# Patient Record
Sex: Female | Born: 1950 | Race: Black or African American | Hispanic: No | Marital: Single | Smoking: Current every day smoker
Health system: Southern US, Community
[De-identification: ages and names within clinical notes are randomized; demographics above are authoritative.]

## PROBLEM LIST (undated history)

## (undated) DIAGNOSIS — I10 Essential (primary) hypertension: Secondary | ICD-10-CM

## (undated) DIAGNOSIS — G459 Transient cerebral ischemic attack, unspecified: Secondary | ICD-10-CM

## (undated) DIAGNOSIS — E7800 Pure hypercholesterolemia, unspecified: Secondary | ICD-10-CM

## (undated) DIAGNOSIS — G43909 Migraine, unspecified, not intractable, without status migrainosus: Secondary | ICD-10-CM

## (undated) DIAGNOSIS — E039 Hypothyroidism, unspecified: Secondary | ICD-10-CM

## (undated) DIAGNOSIS — T8859XA Other complications of anesthesia, initial encounter: Secondary | ICD-10-CM

## (undated) DIAGNOSIS — E079 Disorder of thyroid, unspecified: Secondary | ICD-10-CM

## (undated) DIAGNOSIS — T4145XA Adverse effect of unspecified anesthetic, initial encounter: Secondary | ICD-10-CM

## (undated) DIAGNOSIS — M1990 Unspecified osteoarthritis, unspecified site: Secondary | ICD-10-CM

## (undated) HISTORY — PX: VAGINAL HYSTERECTOMY: SUR661

## (undated) HISTORY — PX: APPENDECTOMY: SHX54

## (undated) HISTORY — PX: PLANTAR FASCIA SURGERY: SHX746

---

## 2005-01-14 ENCOUNTER — Ambulatory Visit: Payer: Self-pay | Admitting: Cardiology

## 2006-12-21 ENCOUNTER — Encounter: Admission: RE | Admit: 2006-12-21 | Discharge: 2006-12-21 | Payer: Self-pay | Admitting: Orthopedic Surgery

## 2013-03-19 ENCOUNTER — Ambulatory Visit: Payer: BC Managed Care – PPO | Attending: *Deleted | Admitting: Physical Therapy

## 2013-03-19 DIAGNOSIS — R5381 Other malaise: Secondary | ICD-10-CM | POA: Insufficient documentation

## 2013-03-19 DIAGNOSIS — M25559 Pain in unspecified hip: Secondary | ICD-10-CM | POA: Insufficient documentation

## 2013-03-19 DIAGNOSIS — IMO0001 Reserved for inherently not codable concepts without codable children: Secondary | ICD-10-CM | POA: Insufficient documentation

## 2013-03-20 ENCOUNTER — Ambulatory Visit: Payer: BC Managed Care – PPO | Admitting: Physical Therapy

## 2013-03-25 ENCOUNTER — Ambulatory Visit: Payer: BC Managed Care – PPO | Admitting: Physical Therapy

## 2013-03-27 ENCOUNTER — Ambulatory Visit: Payer: BC Managed Care – PPO | Admitting: Physical Therapy

## 2013-04-01 ENCOUNTER — Encounter: Payer: BC Managed Care – PPO | Admitting: Physical Therapy

## 2013-04-08 ENCOUNTER — Encounter: Payer: BC Managed Care – PPO | Admitting: Physical Therapy

## 2017-05-01 ENCOUNTER — Observation Stay (HOSPITAL_COMMUNITY): Payer: Medicare Other

## 2017-05-01 ENCOUNTER — Other Ambulatory Visit: Payer: Self-pay

## 2017-05-01 ENCOUNTER — Encounter (HOSPITAL_BASED_OUTPATIENT_CLINIC_OR_DEPARTMENT_OTHER): Payer: Self-pay

## 2017-05-01 ENCOUNTER — Observation Stay (HOSPITAL_BASED_OUTPATIENT_CLINIC_OR_DEPARTMENT_OTHER)
Admission: EM | Admit: 2017-05-01 | Discharge: 2017-05-03 | Disposition: A | Discharge status: Home / Self Care | Payer: Medicare Other | Attending: Family Medicine | Admitting: Family Medicine

## 2017-05-01 ENCOUNTER — Emergency Department (HOSPITAL_BASED_OUTPATIENT_CLINIC_OR_DEPARTMENT_OTHER): Payer: Medicare Other

## 2017-05-01 DIAGNOSIS — I10 Essential (primary) hypertension: Secondary | ICD-10-CM | POA: Diagnosis present

## 2017-05-01 DIAGNOSIS — I639 Cerebral infarction, unspecified: Secondary | ICD-10-CM | POA: Diagnosis present

## 2017-05-01 DIAGNOSIS — Z23 Encounter for immunization: Secondary | ICD-10-CM | POA: Diagnosis not present

## 2017-05-01 DIAGNOSIS — K118 Other diseases of salivary glands: Secondary | ICD-10-CM | POA: Insufficient documentation

## 2017-05-01 DIAGNOSIS — Z79899 Other long term (current) drug therapy: Secondary | ICD-10-CM | POA: Insufficient documentation

## 2017-05-01 DIAGNOSIS — F17200 Nicotine dependence, unspecified, uncomplicated: Secondary | ICD-10-CM | POA: Diagnosis not present

## 2017-05-01 DIAGNOSIS — E785 Hyperlipidemia, unspecified: Secondary | ICD-10-CM | POA: Diagnosis not present

## 2017-05-01 DIAGNOSIS — G459 Transient cerebral ischemic attack, unspecified: Secondary | ICD-10-CM | POA: Diagnosis not present

## 2017-05-01 DIAGNOSIS — R531 Weakness: Secondary | ICD-10-CM | POA: Diagnosis present

## 2017-05-01 HISTORY — DX: Other complications of anesthesia, initial encounter: T88.59XA

## 2017-05-01 HISTORY — DX: Unspecified osteoarthritis, unspecified site: M19.90

## 2017-05-01 HISTORY — DX: Pure hypercholesterolemia, unspecified: E78.00

## 2017-05-01 HISTORY — DX: Disorder of thyroid, unspecified: E07.9

## 2017-05-01 HISTORY — DX: Transient cerebral ischemic attack, unspecified: G45.9

## 2017-05-01 HISTORY — DX: Adverse effect of unspecified anesthetic, initial encounter: T41.45XA

## 2017-05-01 HISTORY — DX: Migraine, unspecified, not intractable, without status migrainosus: G43.909

## 2017-05-01 HISTORY — DX: Essential (primary) hypertension: I10

## 2017-05-01 HISTORY — DX: Hypothyroidism, unspecified: E03.9

## 2017-05-01 LAB — ETHANOL: Alcohol, Ethyl (B): <10 mg/dL (ref <10)

## 2017-05-01 LAB — COMPREHENSIVE METABOLIC PANEL
ALT: 31 U/L (ref 14–54)
ANION GAP: 6 (ref 5–15)
AST: 28 U/L (ref 15–41)
Albumin: 3.6 g/dL (ref 3.5–5.0)
Alkaline Phosphatase: 113 U/L (ref 38–126)
BUN: 16 mg/dL (ref 6–20)
CHLORIDE: 106 mmol/L (ref 101–111)
CO2: 27 mmol/L (ref 22–32)
CREATININE: 0.8 mg/dL (ref 0.44–1.00)
Calcium: 9.3 mg/dL (ref 8.9–10.3)
Glucose, Bld: 109 mg/dL — ABNORMAL HIGH (ref 65–99)
POTASSIUM: 3.4 mmol/L — AB (ref 3.5–5.1)
SODIUM: 139 mmol/L (ref 135–145)
Total Bilirubin: 0.5 mg/dL (ref 0.3–1.2)
Total Protein: 7.1 g/dL (ref 6.5–8.1)

## 2017-05-01 LAB — URINALYSIS, ROUTINE W REFLEX MICROSCOPIC
Bilirubin Urine: NEGATIVE
Glucose, UA: NEGATIVE mg/dL
HGB URINE DIPSTICK: NEGATIVE
Ketones, ur: NEGATIVE mg/dL
LEUKOCYTES UA: NEGATIVE
Nitrite: NEGATIVE
PROTEIN: NEGATIVE mg/dL
Specific Gravity, Urine: 1.015 (ref 1.005–1.030)
pH: 7.5 (ref 5.0–8.0)

## 2017-05-01 LAB — RAPID URINE DRUG SCREEN, HOSP PERFORMED
Amphetamines: NOT DETECTED
BARBITURATES: NOT DETECTED
Benzodiazepines: NOT DETECTED
Cocaine: NOT DETECTED
Opiates: NOT DETECTED
Tetrahydrocannabinol: NOT DETECTED

## 2017-05-01 LAB — CBC
HEMATOCRIT: 39.1 % (ref 36.0–46.0)
Hemoglobin: 12.9 g/dL (ref 12.0–15.0)
MCH: 26.9 pg (ref 26.0–34.0)
MCHC: 33 g/dL (ref 30.0–36.0)
MCV: 81.6 fL (ref 78.0–100.0)
PLATELETS: 199 10*3/uL (ref 150–400)
RBC: 4.79 MIL/uL (ref 3.87–5.11)
RDW: 17.6 % — ABNORMAL HIGH (ref 11.5–15.5)
WBC: 7.2 10*3/uL (ref 4.0–10.5)

## 2017-05-01 LAB — PROTIME-INR
INR: 0.95
PROTHROMBIN TIME: 12.6 s (ref 11.4–15.2)

## 2017-05-01 LAB — DIFFERENTIAL
BASOS PCT: 0 %
Basophils Absolute: 0 10*3/uL (ref 0.0–0.1)
EOS PCT: 2 %
Eosinophils Absolute: 0.1 10*3/uL (ref 0.0–0.7)
Lymphocytes Relative: 21 %
Lymphs Abs: 1.5 10*3/uL (ref 0.7–4.0)
MONO ABS: 0.4 10*3/uL (ref 0.1–1.0)
MONOS PCT: 6 %
NEUTROS ABS: 5.1 10*3/uL (ref 1.7–7.7)
Neutrophils Relative %: 71 %

## 2017-05-01 LAB — CBG MONITORING, ED: GLUCOSE-CAPILLARY: 96 mg/dL (ref 65–99)

## 2017-05-01 LAB — APTT: aPTT: 33 seconds (ref 24–36)

## 2017-05-01 LAB — TROPONIN I

## 2017-05-01 MED ORDER — BENAZEPRIL HCL 20 MG PO TABS
20.0000 mg | ORAL_TABLET | Freq: Every day | ORAL | Status: DC
  Administered 2017-05-02 – 2017-05-03 (×2): 20 mg via ORAL
  Filled 2017-05-01 (×2): qty 1

## 2017-05-01 MED ORDER — STROKE: EARLY STAGES OF RECOVERY BOOK
Freq: Once | Status: AC
  Administered 2017-05-02
  Filled 2017-05-01: qty 1

## 2017-05-01 MED ORDER — ATORVASTATIN CALCIUM 40 MG PO TABS
40.0000 mg | ORAL_TABLET | Freq: Every day | ORAL | Status: DC
  Administered 2017-05-02: 40 mg via ORAL
  Filled 2017-05-01: qty 1

## 2017-05-01 MED ORDER — ASPIRIN 300 MG RE SUPP: 300.0000 mg | Freq: Every day | RECTAL | Status: DC

## 2017-05-01 MED ORDER — ACETAMINOPHEN 160 MG/5ML PO SOLN: 650.0000 mg | ORAL | Status: DC | PRN

## 2017-05-01 MED ORDER — LEVOTHYROXINE SODIUM 25 MCG PO TABS
125.0000 ug | ORAL_TABLET | Freq: Every day | ORAL | Status: DC
  Administered 2017-05-02 – 2017-05-03 (×2): 125 ug via ORAL
  Filled 2017-05-01 (×3): qty 1

## 2017-05-01 MED ORDER — ACETAMINOPHEN 325 MG PO TABS: 650.0000 mg | ORAL_TABLET | ORAL | Status: DC | PRN

## 2017-05-01 MED ORDER — AMLODIPINE BESY-BENAZEPRIL HCL 5-20 MG PO CAPS: 1.0000 | ORAL_CAPSULE | Freq: Every day | ORAL | Status: DC

## 2017-05-01 MED ORDER — ACETAMINOPHEN 650 MG RE SUPP: 650.0000 mg | RECTAL | Status: DC | PRN

## 2017-05-01 MED ORDER — ENOXAPARIN SODIUM 40 MG/0.4ML ~~LOC~~ SOLN
40.0000 mg | SUBCUTANEOUS | Status: DC
  Administered 2017-05-02: 40 mg via SUBCUTANEOUS
  Filled 2017-05-01: qty 0.4

## 2017-05-01 MED ORDER — ASPIRIN 325 MG PO TABS
325.0000 mg | ORAL_TABLET | Freq: Every day | ORAL | Status: DC
  Administered 2017-05-02 – 2017-05-03 (×3): 325 mg via ORAL
  Filled 2017-05-01 (×3): qty 1

## 2017-05-01 MED ORDER — IOPAMIDOL (ISOVUE-370) INJECTION 76%
INTRAVENOUS | Status: AC
  Administered 2017-05-01: 50 mL
  Filled 2017-05-01: qty 50

## 2017-05-01 MED ORDER — SENNOSIDES-DOCUSATE SODIUM 8.6-50 MG PO TABS: 1.0000 | ORAL_TABLET | Freq: Every evening | ORAL | Status: DC | PRN

## 2017-05-01 MED ORDER — LORAZEPAM 2 MG/ML IJ SOLN: 1.0000 mg | Freq: Once | INTRAMUSCULAR | Status: DC

## 2017-05-01 MED ORDER — LORAZEPAM 2 MG/ML IJ SOLN
2.0000 mg | Freq: Once | INTRAMUSCULAR | Status: AC
  Administered 2017-05-01: 2 mg via INTRAVENOUS
  Filled 2017-05-01: qty 1

## 2017-05-01 MED ORDER — HYDROCHLOROTHIAZIDE 25 MG PO TABS
25.0000 mg | ORAL_TABLET | Freq: Every day | ORAL | Status: DC
  Administered 2017-05-02 – 2017-05-03 (×2): 25 mg via ORAL
  Filled 2017-05-01 (×2): qty 1

## 2017-05-01 MED ORDER — AMLODIPINE BESYLATE 5 MG PO TABS
5.0000 mg | ORAL_TABLET | Freq: Every day | ORAL | Status: DC
  Administered 2017-05-02 – 2017-05-03 (×2): 5 mg via ORAL
  Filled 2017-05-01 (×2): qty 1

## 2017-05-01 NOTE — ED Notes (Signed)
Unsuccessful attempt at giving report to 3W.   

## 2017-05-01 NOTE — ED Notes (Signed)
Family at bedside. 

## 2017-05-01 NOTE — ED Notes (Signed)
Report to Renae FicklePaul, RN with [facility]

## 2017-05-01 NOTE — ED Notes (Signed)
EDP at the bedside and tele MD on Tele stroke cart

## 2017-05-01 NOTE — ED Notes (Signed)
On way to CT 

## 2017-05-01 NOTE — ED Notes (Signed)
Report given to Lamount Crankeruth Miller, RN Charge Nurse.

## 2017-05-01 NOTE — ED Notes (Signed)
ED Provider at bedside. 

## 2017-05-01 NOTE — ED Triage Notes (Addendum)
Pt states she was at home on the toilet approx 10am-walked in BR w/o difficuly but then was unable to bear weight on left leg-pt presents to triage in w/c-was able to stand, dragging left leg to stand on scale-pt denies pain to left LE

## 2017-05-01 NOTE — ED Notes (Signed)
[facility]

## 2017-05-01 NOTE — H&P (Signed)
History and Physical    Nancy Houston YQI:[id number] DOB: March 14, 1951 DOA: 05/01/2017  PCP: Domenic Schwab, FNP  Patient coming from: Home  I have personally briefly reviewed patient's old medical records in [facility]  Chief Complaint: L leg weakness  HPI: Nancy Houston is a 67 y.o. female with medical history significant of HTN, HLD, tobacco abuse.  Patient presents to the ED at [facility] with c/o transient left leg weakness.  Got up and walked to bathroom without difficulty.  Urinated.  Had diffuse weakness of left leg when she tried to stand up.  No numbness nor tingling, no numbness.  No pain.  Never had anything like this before.  Steady improvement over course of day now close to baseline.   ED Course: Transferred to [facility] ED, neurology eval.  MRI brain neg.  Getting admitted for TIA work up.   Review of Systems: As per HPI otherwise 10 point review of systems negative.   Past Medical History:  Diagnosis Date  . High cholesterol   . Hypertension   . Thyroid disease     Past Surgical History:  Procedure Laterality Date  . ABDOMINAL HYSTERECTOMY       reports that she has been smoking.  she has never used smokeless tobacco. She reports that she drinks alcohol. She reports that she does not use drugs.  Allergies  Allergen Reactions  . Penicillins     unknown    Family History  Problem Relation Age of Onset  . Transient ischemic attack Mother      Prior to Admission medications   Medication Sig Start Date End Date Taking? Authorizing Provider  amLODipine-benazepril (LOTREL) 5-20 MG capsule Take 1 capsule by mouth daily. 08/09/16  Yes [provider]  atorvastatin (LIPITOR) 40 MG tablet Take 40 mg by mouth daily.    Yes [provider]  hydrochlorothiazide (HYDRODIURIL) 25 MG tablet Take 25 mg by mouth daily. 08/09/16  Yes [provider]  levothyroxine (SYNTHROID) 125 MCG tablet Take 125 mcg by mouth daily.    Yes [provider]    Physical Exam: Vitals:   05/01/17 1830 05/01/17 2011 05/01/17 2014 05/01/17 2030  BP: (!) 110/93 (!) 141/91 (!) 141/91 (!) 146/96  Pulse: 76  94 93  Resp: 15  19 17   Temp:      TempSrc:      SpO2: 99%  97% 94%  Weight:      Height:        Constitutional: NAD, calm, comfortable Eyes: PERRL, lids and conjunctivae normal ENMT: Mucous membranes are moist. Posterior pharynx clear of any exudate or lesions.Normal dentition.  Neck: normal, supple, no masses, no thyromegaly Respiratory: clear to auscultation bilaterally, no wheezing, no crackles. Normal respiratory effort. No accessory muscle use.  Cardiovascular: Regular rate and rhythm, no murmurs / rubs / gallops. No extremity edema. 2+ pedal pulses. No carotid bruits.  Abdomen: no tenderness, no masses palpated. No hepatosplenomegaly. Bowel sounds positive.  Musculoskeletal: no clubbing / cyanosis. No joint deformity upper and lower extremities. Good ROM, no contractures. Normal muscle tone.  Skin: no rashes, lesions, ulcers. No induration Neurologic: CN 2-12 grossly intact. Sensation intact, DTR normal. Strength 5/5 in all 4.  Psychiatric: Normal judgment and insight. Alert and oriented x 3. Normal mood.    Labs on Admission: I have personally reviewed following labs and imaging studies  CBC: Recent Labs  Lab 05/01/17 1355  WBC 7.2  NEUTROABS 5.1  HGB 12.9  HCT  39.1  MCV 81.6  PLT 199   Basic Metabolic Panel: Recent Labs  Lab 05/01/17 1355  NA 139  K 3.4*  CL 106  CO2 27  GLUCOSE 109*  BUN 16  CREATININE 0.80  CALCIUM 9.3   GFR: Estimated Creatinine Clearance: 93.2 mL/min (by C-G formula based on SCr of 0.8 mg/dL). Liver Function Tests: Recent Labs  Lab 05/01/17 1355  AST 28  ALT 31  ALKPHOS 113  BILITOT 0.5  PROT 7.1  ALBUMIN 3.6   No results for input(s): LIPASE, AMYLASE in the last 168 hours. No results for input(s): AMMONIA in the last 168 hours. Coagulation Profile: Recent  Labs  Lab 05/01/17 1355  INR 0.95   Cardiac Enzymes: Recent Labs  Lab 05/01/17 1355  TROPONINI <0.03   BNP (last 3 results) No results for input(s): PROBNP in the last 8760 hours. HbA1C: No results for input(s): HGBA1C in the last 72 hours. CBG: Recent Labs  Lab 05/01/17 1358  GLUCAP 96   Lipid Profile: No results for input(s): CHOL, HDL, LDLCALC, TRIG, CHOLHDL, LDLDIRECT in the last 72 hours. Thyroid Function Tests: No results for input(s): TSH, T4TOTAL, FREET4, T3FREE, THYROIDAB in the last 72 hours. Anemia Panel: No results for input(s): VITAMINB12, FOLATE, FERRITIN, TIBC, IRON, RETICCTPCT in the last 72 hours. Urine analysis:    Component Value Date/Time   COLORURINE YELLOW 05/01/2017 1453   APPEARANCEUR CLEAR 05/01/2017 1453   LABSPEC 1.015 05/01/2017 1453   PHURINE 7.5 05/01/2017 1453   GLUCOSEU NEGATIVE 05/01/2017 1453   HGBUR NEGATIVE 05/01/2017 1453   BILIRUBINUR NEGATIVE 05/01/2017 1453   KETONESUR NEGATIVE 05/01/2017 1453   PROTEINUR NEGATIVE 05/01/2017 1453   NITRITE NEGATIVE 05/01/2017 1453   LEUKOCYTESUR NEGATIVE 05/01/2017 1453    Radiological Exams on Admission: Mr Brain Wo Contrast (neuro Protocol)  Result Date: 05/01/2017 CLINICAL DATA:  LEFT leg weakness after bowel movement. History of hypertension and hypercholesterolemia. EXAM: MRI HEAD WITHOUT CONTRAST TECHNIQUE: Multiplanar, multiecho pulse sequences of the brain and surrounding structures were obtained without intravenous contrast. COMPARISON:  CT HEAD May 01, 2017 at 1412 hours FINDINGS: Multiple sequences are moderately or severely motion degraded. BRAIN: No reduced diffusion to suggest acute ischemia. Mild-to-moderate global parenchymal brain volume loss. No hydrocephalus. Patchy to confluent supratentorial white matter FLAIR T2 hyperintensities. Prominent basal ganglia and thalami perivascular spaces associated with chronic small vessel ischemic disease. No midline shift, mass effect or  masses. No abnormal extra-axial fluid collections. VASCULAR: Normal major intracranial vascular flow voids present at skull base. SKULL AND UPPER CERVICAL SPINE: Partially empty sella. No suspicious calvarial bone marrow signal. Craniocervical junction maintained. SINUSES/ORBITS: Included paranasal sinuses are well aerated. Imaged mastoid air cells are well aerated. The included ocular globes and orbital contents are non-suspicious. Status post bilateral ocular lens implants. OTHER: None. IMPRESSION: 1. No acute intracranial process on this motion degraded examination. 2. Moderate to severe white matter changes seen with chronic small vessel ischemic disease. 3. Mild to moderate parenchymal brain volume loss, advanced for age. 4. Partially empty sella. Electronically Signed   By: Awilda Metroourtnay  Bloomer M.D.   On: 05/01/2017 19:58   Ct Head Code Stroke Wo Contrast  Result Date: 05/01/2017 CLINICAL DATA:  Code stroke. LEFT leg weakness after using toilet this morning. History of hypertension and hypercholesterolemia. EXAM: CT HEAD WITHOUT CONTRAST TECHNIQUE: Contiguous axial images were obtained from the base of the skull through the vertex without intravenous contrast. COMPARISON:  None. FINDINGS: BRAIN: No intraparenchymal hemorrhage, mass effect nor midline shift.  Moderate global parenchymal brain volume loss. No hydrocephalus. Patchy to confluent supratentorial white matter hypodensities. No acute large vascular territory infarcts. No abnormal extra-axial fluid collections. Basal cisterns are patent. VASCULAR: Mild to moderate calcific atherosclerosis of the carotid siphons. No dense intracranial vessels. SKULL: No skull fracture. Partially empty sella. No significant scalp soft tissue swelling. SINUSES/ORBITS: The mastoid air-cells and included paranasal sinuses are well-aerated.The included ocular globes and orbital contents are non-suspicious. OTHER: None. ASPECTS [facility] Stroke Program Early CT Score) -  Ganglionic level infarction (caudate, lentiform nuclei, internal capsule, insula, M1-M3 cortex): 7 - Supraganglionic infarction (M4-M6 cortex): 3 Total score (0-10 with 10 being normal): 10 IMPRESSION: 1. No acute intracranial process. 2. Moderate parenchymal brain volume loss, advanced for age. Moderate to severe chronic small vessel ischemic disease. 3. ASPECTS is 10. 4. Critical Value/emergent results were called by telephone at the time of interpretation on 05/01/2017 at 2:21 pm to Dr. Vanetta Mulders , who verbally acknowledged these results. Electronically Signed   By: Awilda Metro M.D.   On: 05/01/2017 14:21    EKG: Independently reviewed.  Assessment/Plan Principal Problem:   TIA (transient ischemic attack) Active Problems:   HTN (hypertension)    1. TIA - 1. TIA pathway and work up 2. CTA head and neck 3. 2d echo 4. ASA 5. PT/OT/SLP 6. Tele monitor 2. HTN - continue home BP meds  DVT prophylaxis: Lovenox Code Status: Full Family Communication: No family in room Disposition Plan: Home after admit Consults called: Neuro Admission status: Place in obs   Hillary Bow. DO [facility] Pager [phone]  If 7AM-7PM, please contact day team taking care of patient [email or web address] Password [facility]  05/01/2017, 9:59 PM

## 2017-05-01 NOTE — Consult Note (Signed)
   [facility] TeleNeurology Consult Services  Date of Service: 05/01/2017  Impression:  1. Stroke versus TIA right hemisphere, probably thalamus/internal capsule, but also could be anterior cerebral artery territory    Not a tPA candidate due to: symptoms reversing and also at the limit of acceptable window of opportunity Not clearly an NIR candidate due to: no evidence of large vessel occlusion  Differential Diagnosis:   1. Cardioembolic stroke  2. Small vessel disease/lacune  3. Thromboembolic, artery-to-artery mechanism  4. Hypercoagulable state-related infarct  5. Transient ischemic attack  6. Thrombotic mechanism, large artery disease    Comments:   [facility] contacted: 1400 [facility] at bedside: 1408 NIHSS assessment time: 1420  Recommendations:   Inpatient Neurology consultation Stroke evaluation per Neurology/Internal Medicine Discussed with ED MD; needs antiplatelet therapy, vascular studies, blood pressure control, A1c and lipid profile, Echo Please call with questions.  ----------------------------------------------------------------------------------------- CC: left leg weakness  History of Present Illness: this is a 67 year old woman who was last normal at 1000 this morning. Suddenly she had complete paralysis of the left leg only. It would not support her. She was trying to get up off of the commode. She denied numbness anywhere. She had no loss of vision or speech. She has a history of hypertension and her blood pressure was 159/82. She is not diabetic. She has no history of coronary disease or atrial fibrillation. She has hyperlipidemia. She is not on antiplatelet or anticoagulant medication. She is a 1.5 pack per week smoker.   Diagnostic: I reviewed the CT brain without contrast. She has fairly extensive small vessel white matter ischemic change but no evidence of acute ischemic stroke, hemorrhage, or mass lesion.  Exam  Level of  consciousness: alert, keenly responsive = 0 Level of consciousness: month and age: both questions correct = 0 Level of consciousness: commands: performs both tasks = 0 Horizontal EOMs: normal = 0 Visual fields: normal = 0 Test facial palsy: normal symmetry = 0 L arm motor drift: no drift for 10 seconds = 0 R arm motor drift: no drift for 10 seconds = 0 L leg motor drift: no drift for five seconds = 1 but she can hold it up against gravity whereas she could not on arrival R leg motor drift: no drift five seconds = 0 Limb ataxia: (FNF/Heel-Shin): no ataxia = 0 Sensation: normal; no sensory loss = 1 left side, minimal Language/Aphasia: normal: no aphasia = 0 Dysarthria: normal = 0 Extinction/Inattention = 0  NIHSS = 2   Medical Decision Making:  - Extensive number of diagnosis or management options are considered above.   - Extensive amount of complex data reviewed.   - High risk of complication and/or morbidity or mortality are associated with differential diagnostic considerations above.  - There may be Uncertain outcome and increased probability of prolonged functional impairment or high probability of severe prolonged functional impairment associated with some of these differential diagnosis.   Medical Data Reviewed:  1.Data reviewed include clinical labs, radiology,  Medical Tests;   2.Tests results discussed w/performing or interpreting physician;   3.Obtaining/reviewing old medical records;  4.Obtaining case history from another source;  5.Independent review of image, tracing or specimen.    Patient was informed the Neurology Consult would happen via telehealth (remote video) and consented to receiving care in this manner.

## 2017-05-01 NOTE — ED Notes (Signed)
Patient transported to MRI 

## 2017-05-01 NOTE — ED Notes (Signed)
Patient denies pain.  She stated that she was not able to move her left leg this morning approximately 10 am.  Per her niece, niece's son has to help her into the car.  LE temp is cooler than the RE. No swelling noted.

## 2017-05-01 NOTE — ED Provider Notes (Addendum)
[facility] EMERGENCY DEPARTMENT Provider Note   CSN: [id number] Arrival date & time: 05/01/17  1217   An emergency department physician performed an initial assessment on this suspected stroke patient at 1400.  History   Chief Complaint Chief Complaint  Patient presents with  . Weakness    Left LE    HPI Nancy Houston is a 67 y.o. female.  Patient with acute onset of left leg weakness.  Patient walked fine into the bathroom had a bowel movement when she got up her leg would not work properly not associated with any pain it was not numb or tingly like it went to sleep.  No history of anything similar.  Patient stated everything else was working fine.  Leg has slowly improved a little bit but is still very very weak.  No past history of stroke.  Past medical history significant for hypertension high cholesterol and thyroid disease.  The incident occurred at 10 AM.      Past Medical History:  Diagnosis Date  . High cholesterol   . Hypertension   . Thyroid disease     There are no active problems to display for this patient.   Past Surgical History:  Procedure Laterality Date  . ABDOMINAL HYSTERECTOMY      OB History    No data available       Home Medications    Prior to Admission medications   Medication Sig Start Date End Date Taking? Authorizing Provider  AMLODIPINE BESYLATE PO Take by mouth.   Yes [provider]  ATORVASTATIN CALCIUM PO Take by mouth.   Yes [provider]  HYDRALAZINE-HCTZ PO Take by mouth.   Yes [provider]  Levothyroxine Sodium (SYNTHROID PO) Take by mouth.   Yes [provider]    Family History No family history on file.  Social History Social History   Tobacco Use  . Smoking status: Current Every Day Smoker  . Smokeless tobacco: Never Used  Substance Use Topics  . Alcohol use: Yes    Comment: occ  . Drug use: No     Allergies   Penicillins   Review of  Systems Review of Systems  Constitutional: Negative for fever.  HENT: Negative for congestion.   Eyes: Negative for visual disturbance.  Respiratory: Negative for shortness of breath.   Cardiovascular: Negative for chest pain.  Gastrointestinal: Negative for abdominal distention.  Musculoskeletal: Negative for back pain.  Skin: Negative for rash.  Neurological: Positive for weakness. Negative for syncope, speech difficulty, numbness and headaches.  Psychiatric/Behavioral: Negative for confusion.     Physical Exam Updated Vital Signs BP 120/81   Pulse 83   Temp 97.8 F (36.6 C)   Resp 15   Ht 1.676 m (5\' 6" )   Wt 121.6 kg (268 lb 1.3 oz)   SpO2 96%   BMI 43.27 kg/m   Physical Exam  Constitutional: She is oriented to person, place, and time. She appears well-developed and well-nourished. No distress.  HENT:  Head: Normocephalic and atraumatic.  Mouth/Throat: Oropharynx is clear and moist.  Eyes: Conjunctivae and EOM are normal. Pupils are equal, round, and reactive to light.  Neck: Normal range of motion. Neck supple.  Cardiovascular: Normal rate, regular rhythm and normal heart sounds.  Pulmonary/Chest: Effort normal and breath sounds normal.  Abdominal: Soft. Bowel sounds are normal. There is no tenderness.  Musculoskeletal: Normal range of motion.  Neurological: She is alert and oriented to person, place, and time. No  cranial nerve deficit.  On exam patient with weakness to the left leg about 3-4 out of 5.  Sensation grossly intact but decreased compared to right side.  Good cap refill strong dorsalis pedis pulse.  No pain with passive range of motion.  Patient can move her toes can get the left leg off the bed for about 2 seconds.  Finger to nose coordination was intact.  Visual fields intact.  No weakness on the right side no weakness of the left upper extremity.  Skin: Skin is warm.  Nursing note and vitals reviewed.    ED Treatments / Results  Labs (all labs  ordered are listed, but only abnormal results are displayed) Labs Reviewed  CBC - Abnormal; Notable for the following components:      Result Value   RDW 17.6 (*)    All other components within normal limits  COMPREHENSIVE METABOLIC PANEL - Abnormal; Notable for the following components:   Potassium 3.4 (*)    Glucose, Bld 109 (*)    All other components within normal limits  ETHANOL  PROTIME-INR  APTT  DIFFERENTIAL  TROPONIN I  RAPID URINE DRUG SCREEN, HOSP PERFORMED  URINALYSIS, ROUTINE W REFLEX MICROSCOPIC  CBG MONITORING, ED    EKG  EKG Interpretation  Date/Time:  Monday May 01 2017 14:00:07 EST Ventricular Rate:  84 PR Interval:    QRS Duration: 80 QT Interval:  361 QTC Calculation: 427 R Axis:   40 Text Interpretation:  Sinus rhythm Multiple premature complexes, vent & supraven Low voltage, precordial leads Borderline T abnormalities, diffuse leads No previous ECGs available Confirmed by Vanetta Mulders [id number]) on 05/01/2017 2:08:19 PM       Radiology Ct Head Code Stroke Wo Contrast  Result Date: 05/01/2017 CLINICAL DATA:  Code stroke. LEFT leg weakness after using toilet this morning. History of hypertension and hypercholesterolemia. EXAM: CT HEAD WITHOUT CONTRAST TECHNIQUE: Contiguous axial images were obtained from the base of the skull through the vertex without intravenous contrast. COMPARISON:  None. FINDINGS: BRAIN: No intraparenchymal hemorrhage, mass effect nor midline shift. Moderate global parenchymal brain volume loss. No hydrocephalus. Patchy to confluent supratentorial white matter hypodensities. No acute large vascular territory infarcts. No abnormal extra-axial fluid collections. Basal cisterns are patent. VASCULAR: Mild to moderate calcific atherosclerosis of the carotid siphons. No dense intracranial vessels. SKULL: No skull fracture. Partially empty sella. No significant scalp soft tissue swelling. SINUSES/ORBITS: The mastoid air-cells and included  paranasal sinuses are well-aerated.The included ocular globes and orbital contents are non-suspicious. OTHER: None. ASPECTS [facility] Stroke Program Early CT Score) - Ganglionic level infarction (caudate, lentiform nuclei, internal capsule, insula, M1-M3 cortex): 7 - Supraganglionic infarction (M4-M6 cortex): 3 Total score (0-10 with 10 being normal): 10 IMPRESSION: 1. No acute intracranial process. 2. Moderate parenchymal brain volume loss, advanced for age. Moderate to severe chronic small vessel ischemic disease. 3. ASPECTS is 10. 4. Critical Value/emergent results were called by telephone at the time of interpretation on 05/01/2017 at 2:21 pm to Dr. Vanetta Mulders , who verbally acknowledged these results. Electronically Signed   By: Awilda Metro M.D.   On: 05/01/2017 14:21    Procedures Procedures (including critical care time)  CRITICAL CARE Performed by: Vanetta Mulders Total critical care time: 30 minutes Critical care time was exclusive of separately billable procedures and treating other patients. Critical care was necessary to treat or prevent imminent or life-threatening deterioration. Critical care was time spent personally by me on the following activities: development of treatment plan with patient  and/or surrogate as well as nursing, discussions with consultants, evaluation of patient's response to treatment, examination of patient, obtaining history from patient or surrogate, ordering and performing treatments and interventions, ordering and review of laboratory studies, ordering and review of radiographic studies, pulse oximetry and re-evaluation of patient's condition.   Medications Ordered in ED Medications - No data to display   Initial Impression / Assessment and Plan / ED Course  I have reviewed the triage vital signs and the nursing notes.  Pertinent labs & imaging results that were available during my care of the patient were reviewed by me and considered in my  medical decision making (see chart for details).     Patient with symptoms consistent with an acute stroke at 10 in the morning.  Patient underwent code stroke approach here in tele-neurology.  Head CT without any acute findings.  Patient was showing signs of significant improvement in the weakness in the left leg.  But not completely resolved.  But able to hold it up with difficulty for 5 seconds.  Tele-neurology did not recommend TPA.  The patient's stroke score was about a 5.  [facility].  [facility]  Patient will probably need to be transferred to the ED for MRI.  If there were no beds available.   Patient without any other symptoms other than some numbness on the left side along with the lower extremity weakness.  Final Clinical Impressions(s) / ED Diagnoses   Final diagnoses:  Cerebrovascular accident (CVA), unspecified mechanism [facility](HCC)    ED Discharge Orders    None       Vanetta MuldersZackowski, Mikela Senn, MD 05/01/17 1538  Discussed with hospitalist Dr. Onalee Huaavid.  She will accept the patient for monitored bed admission.  No beds currently available.  Patient high risk for staying out here particularly with persistent neuro deficits.  Discussed with Dr. Erin HearingMessner in the emergency department patient will be transferred to the emergency department under his name.  MRI has been ordered.  Neuro hospitalist preferred patient to be transferred to ED.    Vanetta MuldersZackowski, Sho Salguero, MD 05/01/17 1606

## 2017-05-01 NOTE — Progress Notes (Signed)
Arrived from ED5c15. Alert and oriented. Denies any pain. 1 assist stand and pivot to toilet/BSC. Transport via WC from 5c15 to 3w1.

## 2017-05-01 NOTE — ED Notes (Signed)
Patient transported to CT with RN 

## 2017-05-01 NOTE — Consult Note (Addendum)
Neurology Consultation Reason for Consult: Left leg weakness Referring Physician: Silverio LayYao, D  CC: Left leg weakness  History is obtained from: Patient  HPI: Nancy Houston is a 67 y.o. female with a history of hypertension, hyperlipidemia, tobacco abuse who presents with transient left leg weakness.  She got up and walked to the bathroom without difficulty, she then urinated, was not sitting down for long, and when she attempted to stand she had diffuse weakness of her left leg.  She states that she can barely wiggle her toes, but could not move much at her ankle or her hip or knee.  She denies significant numbness.  She denies arm involvement.  She states that it has been steadily improving over the course of the day, now close to her baseline.   LKW: 10:00 AM tpa given?: no, symptoms improved Premorbid modified rankin scale: 0  ROS: A 14 point ROS was performed and is negative except as noted in the HPI.   Past Medical History:  Diagnosis Date  . High cholesterol   . Hypertension   . Thyroid disease      Mother-"mini strokes"  Social History:  reports that she has been smoking.  she has never used smokeless tobacco. She reports that she drinks alcohol. She reports that she does not use drugs.   Exam: Current vital signs: BP (!) 146/96   Pulse 93   Temp 97.9 F (36.6 C) (Oral)   Resp 17   Ht 5\' 6"  (1.676 m)   Wt 121.6 kg (268 lb 1.3 oz)   SpO2 94%   BMI 43.27 kg/m  Vital signs in last 24 hours: Temp:  [97.6 F (36.4 C)-97.9 F (36.6 C)] 97.9 F (36.6 C) (01/21 1749) Pulse Rate:  [76-94] 93 (01/21 2030) Resp:  [13-23] 17 (01/21 2030) BP: (110-161)/(68-96) 146/96 (01/21 2030) SpO2:  [94 %-99 %] 94 % (01/21 2030) Weight:  [121.6 kg (268 lb 1.3 oz)] 121.6 kg (268 lb 1.3 oz) (01/21 1230)   Physical Exam  Constitutional: Appears well-developed and well-nourished.  Psych: Affect appropriate to situation Eyes: No scleral injection HENT: No OP obstrucion Head:  Normocephalic.  Cardiovascular: Normal rate and regular rhythm.  Respiratory: Effort normal, non-labored breathing GI: Soft.  No distension. There is no tenderness.  Skin: WDI  Neuro: Mental Status: Patient is awake, alert, oriented to person, place, month, year, and situation. Patient is able to give a clear and coherent history. No signs of aphasia or neglect Cranial Nerves: II: Visual Fields are full. Pupils are equal, round, and reactive to light.   III,IV, VI: EOMI without ptosis or diploplia.  V: Facial sensation is symmetric to temperature VII: Facial movement is symmetric.  VIII: hearing is intact to voice X: Uvula elevates symmetrically XI: Shoulder shrug is symmetric. XII: tongue is midline without atrophy or fasciculations.  Motor: Tone is normal. Bulk is normal. 5/5 strength was present in bilateral arms, and right leg.  She is hesitant with her left leg, but with encouragement is able to give full strength with hip flexion, knee flexion/extension, ankle dorsiflexion/plantarflexion. Sensory: Sensation is symmetric to light touch and temperature in the arms and legs. Deep Tendon Reflexes: 2+ and symmetric in the biceps and patellae.  1+ ankle bilaterally Cerebellar: FNF intact bilaterally  I have reviewed labs in epic and the results pertinent to this consultation are: CMP-unremarkable  I have reviewed the images obtained: MRI brain-no definite stroke, but there is an area of possible restricted diffusion on the right  side on images 46, 47 of the axial diffusion images, there is also demonstrated on image 16 of the coronal diffusion images.  I agree that this is not definite restricted diffusion, especially given the area that this is visualized is very prone to artifact, but I do think it is possible this represents acute infarct  Impression: 67 year old female with transient left leg weakness.  Distribution is not typical of a peripheral process and the brief duration  she was on the toilet would not be expected to give her neuropraxia.  With no involvement of the contralateral side, sudden onset, rapid improvement I think that spinal dysfunction is also very unlikely.  She has multiple risk factors for cerebrovascular disease and therefore at this point I think that TIA/stroke is most likely culprit.  I would favor treating it as such at this time.  Recommendations: 1. HgbA1c, fasting lipid panel 2.CTA of the head and neck 3. Frequent neuro checks 4. Echocardiogram 5. Carotid dopplers are not needed if CTA done.  6. Prophylactic therapy-Antiplatelet med: Aspirin - dose 325mg  PO or 300mg  PR 7. Risk factor modification 8. Telemetry monitoring 9. PT consult, OT consult, Speech consult 10. please page stroke NP  Or  PA  Or MD  from 8am -4 pm as this patient will be followed by the stroke team at this point.   You can look them up on [email or web address]    Ritta Slot, MD [facility] [phone]  If 7pm- 7am, please page neurology on call as listed in AMION.

## 2017-05-01 NOTE — ED Notes (Signed)
Tele neuro MD on tele stroke cart

## 2017-05-01 NOTE — ED Triage Notes (Signed)
Patient transferred [facility].  Patient seen for L sided weakness.

## 2017-05-01 NOTE — ED Notes (Signed)
Neurologist at bedside. 

## 2017-05-02 ENCOUNTER — Other Ambulatory Visit (HOSPITAL_COMMUNITY): Payer: Medicare Other

## 2017-05-02 ENCOUNTER — Encounter (HOSPITAL_COMMUNITY): Payer: Self-pay | Admitting: General Practice

## 2017-05-02 ENCOUNTER — Observation Stay (HOSPITAL_COMMUNITY): Payer: Medicare Other

## 2017-05-02 ENCOUNTER — Observation Stay (HOSPITAL_BASED_OUTPATIENT_CLINIC_OR_DEPARTMENT_OTHER): Payer: Medicare Other

## 2017-05-02 DIAGNOSIS — I340 Nonrheumatic mitral (valve) insufficiency: Secondary | ICD-10-CM

## 2017-05-02 DIAGNOSIS — G459 Transient cerebral ischemic attack, unspecified: Secondary | ICD-10-CM

## 2017-05-02 LAB — ECHOCARDIOGRAM COMPLETE
AOASC: 34 cm
AVLVOTPG: 14 mmHg
E decel time: 246 msec
E/e' ratio: 8.35
FS: 30 % (ref 28–44)
HEIGHTINCHES: 66 in
IVS/LV PW RATIO, ED: 1.13
LA ID, A-P, ES: 40 mm
LA diam end sys: 40 mm
LA vol: 50 mL
LADIAMINDEX: 1.78 cm/m2
LAVOLA4C: 53 mL
LAVOLIN: 22.2 mL/m2
LV E/e' medial: 8.35
LV E/e'average: 8.35
LV SIMPSON'S DISK: 60
LV TDI E'LATERAL: 8.4
LV TDI E'MEDIAL: 6.23
LV dias vol index: 22 mL/m2
LV dias vol: 49 mL (ref 46–106)
LV sys vol index: 9 mL/m2
LVELAT: 8.4 cm/s
LVOT SV: 107 mL
LVOT VTI: 37.6 cm
LVOT area: 2.84 cm2
LVOT diameter: 19 mm
LVOT peak vel: 185 cm/s
LVSYSVOL: 20 mL (ref 14–42)
MV Dec: 246
MVAP: 3.06 cm2
MVPKAVEL: 129 m/s
MVPKEVEL: 70.1 m/s
P 1/2 time: 72 ms
PW: 16 mm — AB (ref 0.6–1.1)
RV LATERAL S' VELOCITY: 15.2 cm/s
RV TAPSE: 21.9 mm
Stroke v: 30 ml
WEIGHTICAEL: 4208.14 [oz_av]

## 2017-05-02 LAB — LIPID PANEL
Cholesterol: 135 mg/dL (ref 0–200)
HDL: 28 mg/dL — AB (ref >40)
LDL CALC: 82 mg/dL (ref 0–99)
TRIGLYCERIDES: 127 mg/dL (ref <150)
Total CHOL/HDL Ratio: 4.8 RATIO
VLDL: 25 mg/dL (ref 0–40)

## 2017-05-02 LAB — HEMOGLOBIN A1C
Hgb A1c MFr Bld: 6.3 % — ABNORMAL HIGH (ref 4.8–5.6)
Mean Plasma Glucose: 134.11 mg/dL

## 2017-05-02 LAB — HIV ANTIBODY (ROUTINE TESTING W REFLEX): HIV Screen 4th Generation wRfx: NONREACTIVE

## 2017-05-02 MED ORDER — INFLUENZA VAC SPLIT HIGH-DOSE 0.5 ML IM SUSY
0.5000 mL | PREFILLED_SYRINGE | INTRAMUSCULAR | Status: AC
  Administered 2017-05-03: 0.5 mL via INTRAMUSCULAR
  Filled 2017-05-02 (×2): qty 0.5

## 2017-05-02 MED ORDER — ATORVASTATIN CALCIUM 80 MG PO TABS: 80.0000 mg | ORAL_TABLET | Freq: Every day | ORAL | Status: DC

## 2017-05-02 MED ORDER — PNEUMOCOCCAL VAC POLYVALENT 25 MCG/0.5ML IJ INJ
0.5000 mL | INJECTION | INTRAMUSCULAR | Status: AC
  Administered 2017-05-03: 0.5 mL via INTRAMUSCULAR
  Filled 2017-05-02: qty 0.5

## 2017-05-02 MED ORDER — ENOXAPARIN SODIUM 60 MG/0.6ML ~~LOC~~ SOLN
60.0000 mg | SUBCUTANEOUS | Status: DC
  Administered 2017-05-02: 23:00:00 60 mg via SUBCUTANEOUS
  Filled 2017-05-02: qty 0.6

## 2017-05-02 MED ORDER — LORAZEPAM 2 MG/ML IJ SOLN
1.0000 mg | Freq: Once | INTRAMUSCULAR | Status: AC
  Administered 2017-05-02: 1 mg via INTRAVENOUS
  Filled 2017-05-02: qty 1

## 2017-05-02 NOTE — Evaluation (Signed)
Physical Therapy Evaluation Patient Details Name: Nancy Houston MRN: [id number] DOB: 02/05/1951 Today's Date: 05/02/2017   History of Present Illness  Nancy Houston is a 67 y.o. female with medical history significant of HTN, HLD, tobacco abuse.  Patient presents to the ED at [facility] with c/o transient left leg weakness.  Got up and walked to bathroom without difficulty.  Urinated.  Had diffuse weakness of left leg when she tried to stand up.  No numbness nor tingling, no numbness.  No pain.  Never had anything like this before.  Steady improvement over course of day now close to baseline.    Clinical Impression  Pt admitted with above diagnosis. Pt currently with functional limitations due to the deficits listed below (see PT Problem List). PTA, patient was living alone in 1 story home with level entry. Pt was independent with all mobility, ambulating without AD and driving. Upon eval, patient presents with left lower extremity weakness, and mild balance deficits that limit her mobility. Pt ambulating in hallway with supervision and use of RW to increase safety. Pt reports she feels her leg is recovering significantly since yesterday and reports no questions or concerns for return to home.  Pt will benefit from skilled PT to increase their independence and safety with mobility to allow discharge to the venue listed below.       Follow Up Recommendations Home health PT;Supervision for mobility/OOB    Equipment Recommendations  Rolling walker with 5" wheels;3in1 (PT)    Recommendations for Other Services       Precautions / Restrictions Precautions Precautions: Fall Restrictions Weight Bearing Restrictions: No      Mobility  Bed Mobility               General bed mobility comments: OOB at entry  Transfers Overall transfer level: Needs assistance Equipment used: Rolling walker (2 wheeled) Transfers: Sit to/from Stand Sit to Stand: Supervision          General transfer comment: Cues for proximity to RW. patient able to safely rise to standing with slight increase of time and effort  Ambulation/Gait Ambulation/Gait assistance: Min guard;Supervision Ambulation Distance (Feet): 150 Feet Assistive device: Rolling walker (2 wheeled) Gait Pattern/deviations: Step-through pattern;Step-to pattern;Decreased step length - left Gait velocity: decreased   General Gait Details: Progressed to Supervision. Cues for proximity to RW. Patient ambulates with L sided decreased step length but overall good balance inside RW. SpO2= 98% after activity on RA.  Stairs            Wheelchair Mobility    Modified Rankin (Stroke Patients Only)       Balance Overall balance assessment: Needs assistance Sitting-balance support: Feet supported;No upper extremity supported Sitting balance-Leahy Scale: Good     Standing balance support: During functional activity;Bilateral upper extremity supported Standing balance-Leahy Scale: Fair                               Pertinent Vitals/Pain Pain Assessment: No/denies pain Pain Score: 0-No pain    Home Living Family/patient expects to be discharged to:: Private residence Living Arrangements: Alone Available Help at Discharge: Family;Available PRN/intermittently Type of Home: House Home Access: Level entry     Home Layout: One level Home Equipment: None      Prior Function Level of Independence: Independent         Comments: she is independent in ADL's, IADL's, and cared for a toddler  Hand Dominance   Dominant Hand: Right    Extremity/Trunk Assessment   Upper Extremity Assessment Upper Extremity Assessment: Defer to OT evaluation;Overall [facility] for tasks assessed    Lower Extremity Assessment Lower Extremity Assessment: LLE deficits/detail(RLE strength gross 4/5) LLE Deficits / Details: LLE strength 3/5 gross    Cervical / Trunk Assessment Cervical / Trunk  Assessment: Normal  Communication   Communication: No difficulties  Cognition Arousal/Alertness: Awake/alert Behavior During Therapy: WFL for tasks assessed/performed Overall Cognitive Status: Within Functional Limits for tasks assessed                                        General Comments General comments (skin integrity, edema, etc.): Discussed rehab considerations with patient    Exercises     Assessment/Plan    PT Assessment Patient needs continued PT services  PT Problem List Decreased strength;Decreased range of motion;Decreased activity tolerance;Decreased balance;Decreased mobility;Decreased coordination;Decreased knowledge of use of DME;Pain;Obesity       PT Treatment Interventions DME instruction;Gait training;Functional mobility training;Therapeutic activities;Therapeutic exercise;Balance training;Neuromuscular re-education    PT Goals (Current goals can be found in the Care Plan section)  Acute Rehab PT Goals Patient Stated Goal: return home with HHPT PT Goal Formulation: With patient Time For Goal Achievement: 05/09/17 Potential to Achieve Goals: Good    Frequency Min 4X/week   Barriers to discharge        Co-evaluation               AM-PAC PT "6 Clicks" Daily Activity  Outcome Measure Difficulty turning over in bed (including adjusting bedclothes, sheets and blankets)?: A Little Difficulty moving from lying on back to sitting on the side of the bed? : A Little Difficulty sitting down on and standing up from a chair with arms (e.g., wheelchair, bedside commode, etc,.)?: A Little Help needed moving to and from a bed to chair (including a wheelchair)?: A Little Help needed walking in hospital room?: A Little Help needed climbing 3-5 steps with a railing? : A Little 6 Click Score: 18    End of Session Equipment Utilized During Treatment: Gait belt Activity Tolerance: Patient tolerated treatment well Patient left: in chair;with call  bell/phone within reach Nurse Communication: Mobility status PT Visit Diagnosis: Unsteadiness on feet (R26.81);Other abnormalities of gait and mobility (R26.89);Muscle weakness (generalized) (M62.81);Other symptoms and signs involving the nervous system (R29.898)    Time: [id number] PT Time Calculation (min) (ACUTE ONLY): 28 min   Charges:   PT Evaluation $PT Eval Low Complexity: 1 Low PT Treatments $Gait Training: 8-22 mins   PT G Codes:       Etta GrandchildSean Nykira Reddix, PT, DPT Acute Rehab Services Pager: [phone]    Etta GrandchildSean  Myeshia Fojtik 05/02/2017, 8:52 AM

## 2017-05-02 NOTE — Evaluation (Signed)
Occupational Therapy Evaluation Patient Details Name: Nancy Houston MRN: [id number] DOB: 06-17-1950 Today's Date: 05/02/2017    History of Present Illness Nancy Houston is a 67 y.o. female with medical history significant of HTN, HLD, tobacco abuse.  Patient presents to the ED at [facility] with c/o transient left leg weakness.  Got up and walked to bathroom without difficulty.  Urinated.  Had diffuse weakness of left leg when she tried to stand up.  No numbness nor tingling, no numbness.  No pain.  Never had anything like this before.  Steady improvement over course of day now close to baseline.   Clinical Impression   PTA, pt was living alone and independent. Pt currently performing ADLs and functional mobility at [facility] level. Pt presenting with LLE weakness impacting her balance and safety during ADLs. Pt would benefit from further acute OT to facilitate safe dc. Recommend dc home with HHOT to optimize return to PLOF.    Follow Up Recommendations  Home health OT;Supervision/Assistance - 24 hour    Equipment Recommendations  3 in 1 bedside commode    Recommendations for Other Services PT consult     Precautions / Restrictions Precautions Precautions: Fall Restrictions Weight Bearing Restrictions: No      Mobility Bed Mobility               General bed mobility comments: EOB upon arrival  Transfers Overall transfer level: Needs assistance Equipment used: Rolling walker (2 wheeled) Transfers: Sit to/from Stand Sit to Stand: Min guard         General transfer comment: Min Guard for safety    Balance Overall balance assessment: Needs assistance Sitting-balance support: Feet supported;No upper extremity supported Sitting balance-Leahy Scale: Good     Standing balance support: During functional activity;Bilateral upper extremity supported Standing balance-Leahy Scale: Fair                             ADL either performed  or assessed with clinical judgement   ADL Overall ADL's : Needs assistance/impaired Eating/Feeding: Set up;Sitting   Grooming: Oral care;Wash/dry face;Min guard;Standing Grooming Details (indicate cue type and reason): Pt performed grooming at sink with Min Guard for safety. Noted that pt reliant on UE support during majority of ADL tasks (placing hands or elbows on sink). Feel this is close to pt's baseline function. Upper Body Bathing: Set up;Supervision/ safety;Sitting   Lower Body Bathing: Min guard;Sit to/from stand   Upper Body Dressing : Set up;Supervision/safety;Sitting   Lower Body Dressing: Min guard;Sit to/from stand   Toilet Transfer: Min guard;Ambulation;RW(Simulated to recliner)         Tub/Shower Transfer Details (indicate cue type and reason): Pt will need further education on use of 3N1 at tub seat for safety at home. Functional mobility during ADLs: Min guard;Rolling walker General ADL Comments: Pt performing ADLs and fucnitonal mobility at [employer] A level. Pt presenting with decreased balance and weakness in LLE. Feel pt is close to baseline and would benefit from [facility] to optimize return to PLOF. Pt stating "I feel I am at 95%, just not quite there"     Vision Baseline Vision/History: Wears glasses Wears Glasses: Distance only;Reading only(wears during driving ) Patient Visual Report: No change from baseline       Perception     Praxis      Pertinent Vitals/Pain Pain Assessment: No/denies pain Pain Score: 0-No pain     Hand Dominance Right  Extremity/Trunk Assessment Upper Extremity Assessment Upper Extremity Assessment: Overall WFL for tasks assessed   Lower Extremity Assessment Lower Extremity Assessment: LLE deficits/detail LLE Deficits / Details: LLE strength 3/5 gross   Cervical / Trunk Assessment Cervical / Trunk Assessment: Normal   Communication Communication Communication: No difficulties   Cognition Arousal/Alertness:  Awake/alert Behavior During Therapy: WFL for tasks assessed/performed Overall Cognitive Status: Within Functional Limits for tasks assessed                                     General Comments  Discussed HH services and recommendation for 3N1    Exercises     Shoulder Instructions      Home Living Family/patient expects to be discharged to:: Private residence Living Arrangements: Alone Available Help at Discharge: Family;Available PRN/intermittently Type of Home: House Home Access: Level entry     Home Layout: One level     Bathroom Shower/Tub: Chief Strategy OfficerTub/shower unit   Bathroom Toilet: Standard     Home Equipment: None          Prior Functioning/Environment Level of Independence: Independent        Comments: she is independent in ADL's, IADL's, driving, and cared for a toddler        OT Problem List: Decreased strength;Impaired balance (sitting and/or standing);Decreased range of motion;Decreased knowledge of use of DME or AE      OT Treatment/Interventions: Self-care/ADL training;Therapeutic exercise;Energy conservation;DME and/or AE instruction;Therapeutic activities;Patient/family education    OT Goals(Current goals can be found in the care plan section) Acute Rehab OT Goals Patient Stated Goal: Go home today OT Goal Formulation: With patient Time For Goal Achievement: 05/16/17 Potential to Achieve Goals: Good ADL Goals Pt Will Perform Lower Body Dressing: with modified independence;sit to/from stand Pt Will Transfer to Toilet: with modified independence;ambulating;bedside commode Pt Will Perform Tub/Shower Transfer: Tub transfer;with modified independence;3 in 1;rolling walker;ambulating Additional ADL Goal #1: Pt will verbalize three fall prevention strategies with 1-2 VCs  OT Frequency: Min 2X/week   Barriers to D/C:            Co-evaluation              AM-PAC PT "6 Clicks" Daily Activity     Outcome Measure Help from another  person eating meals?: None Help from another person taking care of personal grooming?: A Little Help from another person toileting, which includes using toliet, bedpan, or urinal?: A Little Help from another person bathing (including washing, rinsing, drying)?: A Little Help from another person to put on and taking off regular upper body clothing?: None Help from another person to put on and taking off regular lower body clothing?: A Little 6 Click Score: 20   End of Session Equipment Utilized During Treatment: Gait belt;Rolling walker Nurse Communication: Mobility status  Activity Tolerance: Patient tolerated treatment well Patient left: in chair;with call bell/phone within reach;with chair alarm set;Other (comment)(with PT)  OT Visit Diagnosis: Unsteadiness on feet (R26.81);Other abnormalities of gait and mobility (R26.89);Muscle weakness (generalized) (M62.81)                Time: [id number] OT Time Calculation (min): 22 min Charges:  OT General Charges $OT Visit: 1 Visit OT Evaluation $OT Eval Moderate Complexity: 1 Mod G-Codes:     Nancy Houston MSOT, OTR/L Acute Rehab Pager: [phone] Office: [phone]  Nancy GristCharis M Janesia Houston 05/02/2017, 10:13 AM

## 2017-05-02 NOTE — Care Management Note (Signed)
Case Management Note  Patient Details  Name: Delfin GantDelphine Delores Dayton MRN: [id number] Date of Birth: [birth year]  Subjective/Objective:   TIA                 Action/Plan: Spoke to pt at bedside. Requested RW for home. Contacted [facility] for RW for home. Pt recommended [facility]PT. [facility] Contacted [facility] rep with new referral.   PCP Domenic SchwabLONG, JOANNA MD   Expected Discharge Date:                Expected Discharge Plan:  Home w Home Health Services  In-House Referral:  NA  Discharge planning Services  CM Consult  Post Acute Care Choice:  Home Health Choice offered to:  Patient  DME Arranged:  Walker rolling DME Agency:  [facility]  HH Arranged:  PT [facility]  Status of Service:  Completed, signed off  If discussed at Long Length of Stay Meetings, dates discussed:    Additional Comments:  Elliot CousinShavis, Issaiah Seabrooks Ellen, RN 05/02/2017, 5:06 PM

## 2017-05-02 NOTE — Progress Notes (Signed)
NEUROHOSPITALISTS STROKE TEAM - DAILY PROGRESS NOTE   ADMISSION HISTORY: Nancy Houston is a 67 y.o. female with a history of hypertension, hyperlipidemia, tobacco abuse who presents with transient left leg weakness.  She got up and walked to the bathroom without difficulty, she then urinated, was not sitting down for long, and when she attempted to stand she had diffuse weakness of her left leg.  She states that she can barely wiggle her toes, but could not move much at her ankle or her hip or knee.  She denies significant numbness.  She denies arm involvement.  She states that it has been steadily improving over the course of the day, now close to her baseline.  LKW: 10:00 AM tpa given?: no, symptoms improved Premorbid modified rankin scale: 0  SUBJECTIVE (INTERVAL HISTORY) No family is at the bedside. Patient is found laying in bed in NAD. Overall she feels her condition is gradually improving. Voices no new complaints. No new events reported overnight. Patient states was able to ambulate in hallway with PT this AM   OBJECTIVE Lab Results: CBC:  Recent Labs  Lab 05/01/17 1355  WBC 7.2  HGB 12.9  HCT 39.1  MCV 81.6  PLT 199   BMP: Recent Labs  Lab 05/01/17 1355  NA 139  K 3.4*  CL 106  CO2 27  GLUCOSE 109*  BUN 16  CREATININE 0.80  CALCIUM 9.3   Liver Function Tests:  Recent Labs  Lab 05/01/17 1355  AST 28  ALT 31  ALKPHOS 113  BILITOT 0.5  PROT 7.1  ALBUMIN 3.6   Cardiac Enzymes:  Recent Labs  Lab 05/01/17 1355  TROPONINI <0.03   Coagulation Studies:  Recent Labs    05/01/17 1355  APTT 33  INR 0.95   PHYSICAL EXAM Temp:  [97.6 F (36.4 C)-98.5 F (36.9 C)] 98.5 F (36.9 C) (01/22 0905) Pulse Rate:  [76-102] 102 (01/22 0905) Resp:  [13-23] 20 (01/22 0905) BP: (110-161)/(60-96) 111/70 (01/22 0905) SpO2:  [92 %-99 %] 95 % (01/22 0905) Weight:  [119.3 kg (263 lb 0.1 oz)-121.6 kg (268 lb  1.3 oz)] 119.3 kg (263 lb 0.1 oz) (01/21 2359) General - OBs middle-aged African-American lady, in no apparent distress HEENT-  Normocephalic,  Cardiovascular - Regular rate and rhythm  Respiratory - Lungs clear bilaterally. No wheezing. Abdomen - soft and non-tender, BS normal Extremities- no edema or cyanosis Mental Status: Patient is awake, alert, oriented to person, place, month, year, and situation. Patient is able to give a clear and coherent history. No signs of aphasia or neglect Cranial Nerves: II: Visual Fields are full. Pupils are equal, round, and reactive to light.   III,IV, VI: EOMI without ptosis or diploplia.  V: Facial sensation is symmetric to temperature VII: Facial movement is symmetric.  VIII: hearing is intact to voice X: Uvula elevates symmetrically XI: Shoulder shrug is symmetric. XII: tongue is midline without atrophy or fasciculations.  Motor: Tone is normal. Bulk is normal. 5/5 strength was present in bilateral arms, and right leg. 4/5 strength in  left leg both proximally and distally involving all muscles,  Sensory: Sensation is symmetric to light touch and temperature in the arms and legs. Deep Tendon Reflexes: 2+ and symmetric in the biceps and patellae.  1+ ankle bilaterally, reflexes more brisk on left side than right Cerebellar: FNF intact bilaterally  IMAGING: I have personally reviewed the radiological images below and agree with the radiology interpretations.  Ct Angio Head Neck W  Or Wo Contrast Result Date: 05/01/2017 IMPRESSION: 1. Negative CTA for large vessel occlusion. 2. Atheromatous plaque about the right carotid bifurcation with associated short-segment stenosis of up to 50% by NASCET criteria. 3. No other high-grade or correctable stenosis about the major arterial vasculature of the head and neck. 4. 4 mm right PICA aneurysm. 5. Approximate 3 cm left parotid mass. Nonemergent ENT consultation suggested for further evaluation.  Electronically Signed   By: Rise Mu M.D.   On: 05/01/2017 23:49   Mr Brain Wo Contrast (neuro Protocol) Result Date: 05/01/2017 IMPRESSION: 1. No acute intracranial process on this motion degraded examination. 2. Moderate to severe white matter changes seen with chronic small vessel ischemic disease. 3. Mild to moderate parenchymal brain volume loss, advanced for age. 4. Partially empty sella. Electronically Signed   By: Awilda Metro M.D.   On: 05/01/2017 19:58   Ct Head Code Stroke Wo Contrast Result Date: 05/01/2017 IMPRESSION: 1. No acute intracranial process. 2. Moderate parenchymal brain volume loss, advanced for age. Moderate to severe chronic small vessel ischemic disease. 3. ASPECTS is 10. 4. Critical Value/emergent results were called by telephone at the time of interpretation on 05/01/2017 at 2:21 pm to Dr. Vanetta Mulders , who verbally acknowledged these results. Electronically Signed   By: Awilda Metro M.D.   On: 05/01/2017 14:21   Echocardiogram:                                              PENDING MRI Lumbar Spine                                           PENDING    IMPRESSION: Nancy Houston is a 67 y.o. female with PMH of HTN, HLD, Pre-Diabetes who presents with sudden onset Left leg weakness with rapid improvement. CT and MRI is negative for stroke. Patient likely suffered a small stroke not seen on MRI.  Likely right brain TIA:  Suspected Etiology: Likely small vessel disease Resultant Symptoms: Left leg weakness Stroke Risk Factors: diabetes mellitus, hyperlipidemia and hypertension Other Stroke Risk Factors: Advanced age, Cigarette smoker, Morbid Obesity, Body mass index is 42.45 kg/m. , Family hx stroke   Outstanding Stroke Work-up Studies:     Echocardiogram:                                                    PENDING MRI Lumbar Spine:                                                 PENDING  05/02/2017 ASSESSMENT:   Patient remains  weak in Left leg. Was able to ambulate in hallway with PT this AM. Likely weakness from small stroke not seen on MRI. Imaging and POC reviewed with patient and all questions answered. Likely to be discharged after workup completed today.  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  Outpatient ENT consultation for further evaluation.   HYPERTENSION: Stable Permissive hypertension (OK if <220/120) for 24-48 hours post stroke and then gradually normalized within 5-7 days. Long term BP goal normotensive. May slowly restart home B/P medications after 48 hours Home Meds: Lotrel, HCTZ  HYPERLIPIDEMIA:    Component Value Date/Time   CHOL 135 05/02/2017 0158   TRIG 127 05/02/2017 0158   HDL 28 (L) 05/02/2017 0158   CHOLHDL 4.8 05/02/2017 0158   VLDL 25 05/02/2017 0158   LDLCALC 82 05/02/2017 0158  Home Meds:  Lipitor mg  LDL  goal < 70 Increased to  Lipitor to 80 mg daily Continue statin at discharge  PRE-DIABETES: Lab Results  Component Value Date   HGBA1C 6.3 (H) 05/02/2017  HgbA1c goal < 7.0 Continue CBG monitoring and SSI to maintain glucose 140-180 mg/dl DM education   TOBACCO ABUSE  Current smoker Smoking cessation counseling provided Nicotine patch provided  OBESITY Morbid Obesity, Body mass index is 42.45 kg/m.   Other Active Problems: Principal Problem:   TIA (transient ischemic attack) Active Problems:   HTN (hypertension)    Hospital day # 0 VTE prophylaxis: Lovenox   Diet : Fall precautions Diet Heart Room service appropriate? Yes; Fluid consistency: Thin   FAMILY UPDATES: family at bedside  TEAM UPDATES: Rolly Salter, MD   Prior Home Stroke Medications:  No antithrombotic  Discharge Stroke Meds:  Please discharge patient on aspirin 325 mg daily   Disposition: Final discharge disposition not confirmed Therapy Recs:               HOME with [facility] PT/OT Follow Up:  Follow-up Information    Micki Riley, MD. Schedule an appointment as soon as possible for a visit in 6 week(s).   Specialties:  Neurology, Radiology Contact information: [address] [place] [address] [phone]          Domenic Schwab, FNP -PCP Follow up in 1-2 weeks      Assessment & plan discussed with with attending physician and they are in agreement.    Brita Romp Stroke Neurology Team 05/02/2017 11:58 AM I have personally examined this patient, reviewed notes, independently viewed imaging studies, participated in medical decision making and plan of care.ROS completed by me personally and pertinent positives fully documented  I have made any additions or clarifications directly to the above note. Agree with note above.  She presented with sudden onset of left leg weakness involving all muscles but MRI scan does not show definite right brain infarct she likely has a TIA due to small vessel disease. The common aspirin and statin. Aggressive risk factor modification. Long discussion with the patient and Dr. Allena Katz. Greater than 50% time during this 25 minute visit was spent on counseling and coordination of care about TIA and stroke risk, prevention and treatment discussion and answering questions Neurology to sign-off once all imaging reviewed. Please call with any further questions or concerns. Thank you for this consultation Delia Heady, MD Medical Director [facility] Pager: [phone] 05/02/2017 1:25 PM  .  To contact  Stroke Continuity provider, please  refer to [email or web address] After hours, contact General Neurology

## 2017-05-02 NOTE — Care Management Obs Status (Signed)
MEDICARE OBSERVATION STATUS NOTIFICATION   Patient Details  Name: Nancy Houston MRN: [id number] Date of Birth: December 04, 1950   Medicare Observation Status Notification Given:  Yes    Elliot CousinShavis, Nikka Hakimian Ellen, RN 05/02/2017, 4:30 PM

## 2017-05-02 NOTE — Evaluation (Signed)
Speech Language Pathology Evaluation Patient Details Name: Nancy GantDelphine Delores Houston MRN: [id number] DOB: May 13, 1950 Today's Date: 05/02/2017 Time: [id number] SLP Time Calculation (min) (ACUTE ONLY): 32 min  Problem List:  Patient Active Problem List   Diagnosis Date Noted  . TIA (transient ischemic attack) 05/01/2017  . HTN (hypertension) 05/01/2017   Past Medical History:  Past Medical History:  Diagnosis Date  . High cholesterol   . Hypertension   . Thyroid disease    Past Surgical History:  Past Surgical History:  Procedure Laterality Date  . ABDOMINAL HYSTERECTOMY     HPI:  67 yo female with h/o HTN, HLD, tobacco use - admitted with transient left leg weakness.  Pt MRI negative for acute change, showed moderate to severe white matter changes, parenchymal volume loss - advanced for age.  Speech eval ordered as part of stroke team.    Assessment / Plan / Recommendation Clinical Impression  MOCA 7.1 administered with pt scoring 27/30 - within normal limits is 26/30.  Sustained attention during lengthier tasks was impaired resulting in pt benefiting from verbal cues/repetition for verbal fluency/serial subtraction.  NO focal CN deficits apparent and speech was fluent without aphasia.  Pt's daughter arrived to session and was educated to findings/recommendations and safety precautions.  NO SlP follow up needed. Thanks for this consult of this most pleasant patient.     SLP Assessment  SLP Recommendation/Assessment: Patient does not need any further Speech Lanaguage Pathology Services    Follow Up Recommendations  None    Frequency and Duration     none      SLP Evaluation Cognition  Overall Cognitive Status: Within Functional Limits for tasks assessed Arousal/Alertness: Awake/alert Orientation Level: Oriented X4 Attention: Focused;Sustained Focused Attention: Appears intact Sustained Attention: Appears intact Memory: Impaired(recalled 4/5 words I, 1 with category  cue) Memory Impairment: Retrieval deficit(minimal) Awareness: Appears intact Problem Solving: Appears intact Safety/Judgment: Appears intact       Comprehension  Auditory Comprehension Overall Auditory Comprehension: Appears within functional limits for tasks assessed Yes/No Questions: Not tested Commands: Within Functional Limits Conversation: Complex Visual Recognition/Discrimination Discrimination: Within Function Limits Reading Comprehension Reading Status: Not tested    Expression Expression Primary Mode of Expression: Verbal Verbal Expression Overall Verbal Expression: Appears within functional limits for tasks assessed Initiation: No impairment Repetition: No impairment Naming: No impairment Pragmatics: No impairment Written Expression Dominant Hand: Right Written Expression: Not tested   Oral / Motor  Oral Motor/Sensory Function Overall Oral Motor/Sensory Function: Within functional limits Motor Speech Overall Motor Speech: Appears within functional limits for tasks assessed Respiration: Within functional limits Resonance: Within functional limits Articulation: Within functional limitis Intelligibility: Intelligible Motor Planning: Witnin functional limits Motor Speech Errors: Not applicable   GO                    Chales AbrahamsKimball, Gizella [place] Ann 05/02/2017, 11:20 AM  Donavan Burnetamara Muranda Coye, MS [facility] SLP [phone]

## 2017-05-02 NOTE — Progress Notes (Signed)
[facility] Progress Note  Patient: Nancy Katherine RoanDelores Stupka WUJ:[id number]   PCP: Domenic SchwabLong, Joanna, FNP DOB: 05-01-1950   DOA: 05/01/2017   DOS: 05/02/2017   Date of Service: the patient was seen and examined on 05/02/2017  Subjective: Feeling better, although still has left leg weakness but is able to ambulate with assistance.  No nausea no vomiting no chest pain.  No other acute complaints.  No pain in the leg.  Brief hospital course: Pt. with PMH of HTN, HLD, active smoker, obesity; admitted on 05/01/2017, presented with complaint of left leg weakness, was found to have TIA. Currently further plan is current care.  Assessment and Plan: 1.  TIA. Presented with left-sided weakness. Neurology was consulted in the ER. CT head unremarkable. MRI brain negative for any acute stroke. CT head angiogram of neck and head negative for any large vessel occlusion. LDL elevated. Lipitor increased to 80 mg daily. Recommended aspirin. Neurology currently signed off, recommend outpatient follow-up in 6 weeks. No further workup recommended from their perspective. PT OT recommends home with home health.  2.  Left-sided weakness. With some back pain. MRI lumbar spine to rule out any acute abnormality.  3.  3 cm parotid mass. Patient will need outpatient ENT evaluation for further workup. No comments regarding the mass appearing malignant versus benign. I informed the patient regarding this finding.  4.  Peripheral vascular disease. Right carotid stenosis of 50%. Continue outpatient neurology follow-up.  5.  Essential hypertension. Resume blood pressure medication tomorrow. On Lotrel and HCTZ.  6.  Morbid obesity. BMI 42. Possible sleep apnea undiagnosed.  Outpatient workup recommended.  7.  Active smoker. Nicotine patch provided, smoking cessation provided.  Diet: Carb modified diet DVT Prophylaxis: subcutaneous Heparin  Advance goals of care discussion: full code  Family  Communication: no family was present at bedside, at the time of interview.  Disposition:  Discharge to home.  Consultants: neurology Procedures: Echocardiogram pending  Antibiotics: Anti-infectives (From admission, onward)   None       Objective: Physical Exam: Vitals:   05/02/17 0200 05/02/17 0528 05/02/17 0905 05/02/17 1309  BP: 116/61 116/60 111/70 (!) 123/51  Pulse: 89 85 (!) 102 88  Resp: 20 18 20 20   Temp: 98.4 F (36.9 C) 98.4 F (36.9 C) 98.5 F (36.9 C) 98.5 F (36.9 C)  TempSrc: Axillary Oral Oral Oral  SpO2: 94% 93% 95% 95%  Weight:      Height:       No intake or output data in the 24 hours ending 05/02/17 1408 Filed Weights   05/01/17 1230 05/01/17 2359  Weight: 121.6 kg (268 lb 1.3 oz) 119.3 kg (263 lb 0.1 oz)   General: Alert, Awake and Oriented to Time, Place and Person. Appear in mild distress, affect appropriate Eyes: PERRL, Conjunctiva normal ENT: Oral Mucosa clear moist. Neck: no JVD, no Abnormal Mass Or lumps Cardiovascular: S1 and S2 Present, no Murmur, Peripheral Pulses Present Respiratory: normal respiratory effort, Bilateral Air entry equal and Decreased, no use of accessory muscle, Clear to Auscultation, no Crackles, no wheezes Abdomen: Bowel Sound present, Soft and no tenderness, no hernia Skin: no redness, no Rash, no induration Extremities: no Pedal edema, no calf tenderness Neurologic: Grossly no focal neuro deficit.  Left-sided weakness, patient is able to lift it up against gravity although not against pressure.  Data Reviewed: CBC: Recent Labs  Lab 05/01/17 1355  WBC 7.2  NEUTROABS 5.1  HGB 12.9  HCT 39.1  MCV 81.6  PLT 199  Basic Metabolic Panel: Recent Labs  Lab 05/01/17 1355  NA 139  K 3.4*  CL 106  CO2 27  GLUCOSE 109*  BUN 16  CREATININE 0.80  CALCIUM 9.3    Liver Function Tests: Recent Labs  Lab 05/01/17 1355  AST 28  ALT 31  ALKPHOS 113  BILITOT 0.5  PROT 7.1  ALBUMIN 3.6   No results for  input(s): LIPASE, AMYLASE in the last 168 hours. No results for input(s): AMMONIA in the last 168 hours. Coagulation Profile: Recent Labs  Lab 05/01/17 1355  INR 0.95   Cardiac Enzymes: Recent Labs  Lab 05/01/17 1355  TROPONINI <0.03   BNP (last 3 results) No results for input(s): PROBNP in the last 8760 hours. CBG: Recent Labs  Lab 05/01/17 1358  GLUCAP 96   Studies: Ct Angio Head W Or Wo Contrast  Result Date: 05/01/2017 CLINICAL DATA:  Initial evaluation for acute left leg weakness. EXAM: CT ANGIOGRAPHY HEAD AND NECK TECHNIQUE: Multidetector CT imaging of the head and neck was performed using the standard protocol during bolus administration of intravenous contrast. Multiplanar CT image reconstructions and MIPs were obtained to evaluate the vascular anatomy. Carotid stenosis measurements (when applicable) are obtained utilizing NASCET criteria, using the distal internal carotid diameter as the denominator. CONTRAST:  50mL ISOVUE-370 IOPAMIDOL (ISOVUE-370) INJECTION 76% COMPARISON:  Comparison made with prior CT and MRI from earlier the same day. FINDINGS: CTA NECK FINDINGS Aortic arch: Visualized aortic arch of normal caliber with normal branch pattern. Scattered atheromatous plaque about the arch itself and origin of the great vessels without flow-limiting stenosis. Visualized subclavian arteries widely patent. Right carotid system: Right common carotid artery the tortuous proximally but widely patent to the bifurcation. Calcified plaque about the right carotid bifurcation with associated stenosis of up to 50% by NASCET criteria. Right ICA widely patent distally to the skull base without stenosis, dissection, or occlusion. Left carotid system: Left common carotid artery tortuous but widely patent to the bifurcation without stenosis. Mild atheromatous plaque about the left bifurcation without significant narrowing. Left ICA widely patent to the skull base without stenosis, dissection, or  occlusion. Vertebral arteries: Both of the vertebral arteries arise from the subclavian arteries. Vertebral arteries are tortuous proximally but widely patent to the skull base without stenosis, dissection, or occlusion. Skeleton: No acute osseus abnormality. No worrisome lytic or blastic osseous lesions. Mild-to-moderate spondylolysis noted at C6-7. Other neck: No acute soft tissue abnormality within the neck. Subcentimeter dystrophic calcification noted within left lobe of thyroid. No adenopathy. Fairly well-circumscribed mildly hyperdense mass position within the superficial lobe of the left parotid gland measures 2.1 x 1.8 x 3.1 cm (series 5, image 89). Few associated subcentimeter calcifications. Upper chest: Visualized upper chest within normal limits. Partially visualized lungs are clear. Review of the MIP images confirms the above findings CTA HEAD FINDINGS Anterior circulation: Petrous segments widely patent bilaterally. Scattered calcified plaque within the cavernous/supraclinoid ICAs with mild to moderate multifocal narrowing. ICA termini widely patent. A1 segments mildly irregular but patent. Normal anterior communicating artery. Anterior cerebral arteries widely patent to their distal aspects without stenosis. M1 segments demonstrate mild atheromatous irregularity but are widely patent without stenosis. No proximal M2 occlusion distal MCA branches well perfused and symmetric. Distal small vessel atheromatous irregularity. Posterior circulation: Vertebral arteries widely patent to the vertebrobasilar junction without flow-limiting stenosis. Posterior inferior cerebral arteries patent bilaterally. 4 x 4 x 4 mm focal outpouching arising at the takeoff of the right PICA consistent with a small  aneurysm. Posterior inferior cerebral arteries patent bilaterally. Basilar artery widely patent to its distal aspect. Superior cerebral arteries patent bilaterally. Both of the posterior cerebral artery supplied via  the basilar and are well perfused to their distal aspects. Venous sinuses: Patent. Anatomic variants: None significant. Delayed phase: No abnormal enhancement. Review of the MIP images confirms the above findings IMPRESSION: 1. Negative CTA for large vessel occlusion. 2. Atheromatous plaque about the right carotid bifurcation with associated short-segment stenosis of up to 50% by NASCET criteria. 3. No other high-grade or correctable stenosis about the major arterial vasculature of the head and neck. 4. 4 mm right PICA aneurysm. 5. Approximate 3 cm left parotid mass. Nonemergent ENT consultation suggested for further evaluation. Electronically Signed   By: Rise Mu M.D.   On: 05/01/2017 23:49   Ct Angio Neck W Or Wo Contrast  Result Date: 05/01/2017 CLINICAL DATA:  Initial evaluation for acute left leg weakness. EXAM: CT ANGIOGRAPHY HEAD AND NECK TECHNIQUE: Multidetector CT imaging of the head and neck was performed using the standard protocol during bolus administration of intravenous contrast. Multiplanar CT image reconstructions and MIPs were obtained to evaluate the vascular anatomy. Carotid stenosis measurements (when applicable) are obtained utilizing NASCET criteria, using the distal internal carotid diameter as the denominator. CONTRAST:  50mL ISOVUE-370 IOPAMIDOL (ISOVUE-370) INJECTION 76% COMPARISON:  Comparison made with prior CT and MRI from earlier the same day. FINDINGS: CTA NECK FINDINGS Aortic arch: Visualized aortic arch of normal caliber with normal branch pattern. Scattered atheromatous plaque about the arch itself and origin of the great vessels without flow-limiting stenosis. Visualized subclavian arteries widely patent. Right carotid system: Right common carotid artery the tortuous proximally but widely patent to the bifurcation. Calcified plaque about the right carotid bifurcation with associated stenosis of up to 50% by NASCET criteria. Right ICA widely patent distally to the  skull base without stenosis, dissection, or occlusion. Left carotid system: Left common carotid artery tortuous but widely patent to the bifurcation without stenosis. Mild atheromatous plaque about the left bifurcation without significant narrowing. Left ICA widely patent to the skull base without stenosis, dissection, or occlusion. Vertebral arteries: Both of the vertebral arteries arise from the subclavian arteries. Vertebral arteries are tortuous proximally but widely patent to the skull base without stenosis, dissection, or occlusion. Skeleton: No acute osseus abnormality. No worrisome lytic or blastic osseous lesions. Mild-to-moderate spondylolysis noted at C6-7. Other neck: No acute soft tissue abnormality within the neck. Subcentimeter dystrophic calcification noted within left lobe of thyroid. No adenopathy. Fairly well-circumscribed mildly hyperdense mass position within the superficial lobe of the left parotid gland measures 2.1 x 1.8 x 3.1 cm (series 5, image 89). Few associated subcentimeter calcifications. Upper chest: Visualized upper chest within normal limits. Partially visualized lungs are clear. Review of the MIP images confirms the above findings CTA HEAD FINDINGS Anterior circulation: Petrous segments widely patent bilaterally. Scattered calcified plaque within the cavernous/supraclinoid ICAs with mild to moderate multifocal narrowing. ICA termini widely patent. A1 segments mildly irregular but patent. Normal anterior communicating artery. Anterior cerebral arteries widely patent to their distal aspects without stenosis. M1 segments demonstrate mild atheromatous irregularity but are widely patent without stenosis. No proximal M2 occlusion distal MCA branches well perfused and symmetric. Distal small vessel atheromatous irregularity. Posterior circulation: Vertebral arteries widely patent to the vertebrobasilar junction without flow-limiting stenosis. Posterior inferior cerebral arteries patent  bilaterally. 4 x 4 x 4 mm focal outpouching arising at the takeoff of the right PICA consistent with a  small aneurysm. Posterior inferior cerebral arteries patent bilaterally. Basilar artery widely patent to its distal aspect. Superior cerebral arteries patent bilaterally. Both of the posterior cerebral artery supplied via the basilar and are well perfused to their distal aspects. Venous sinuses: Patent. Anatomic variants: None significant. Delayed phase: No abnormal enhancement. Review of the MIP images confirms the above findings IMPRESSION: 1. Negative CTA for large vessel occlusion. 2. Atheromatous plaque about the right carotid bifurcation with associated short-segment stenosis of up to 50% by NASCET criteria. 3. No other high-grade or correctable stenosis about the major arterial vasculature of the head and neck. 4. 4 mm right PICA aneurysm. 5. Approximate 3 cm left parotid mass. Nonemergent ENT consultation suggested for further evaluation. Electronically Signed   By: Rise Mu M.D.   On: 05/01/2017 23:49   Mr Brain Wo Contrast (neuro Protocol)  Result Date: 05/01/2017 CLINICAL DATA:  LEFT leg weakness after bowel movement. History of hypertension and hypercholesterolemia. EXAM: MRI HEAD WITHOUT CONTRAST TECHNIQUE: Multiplanar, multiecho pulse sequences of the brain and surrounding structures were obtained without intravenous contrast. COMPARISON:  CT HEAD May 01, 2017 at 1412 hours FINDINGS: Multiple sequences are moderately or severely motion degraded. BRAIN: No reduced diffusion to suggest acute ischemia. Mild-to-moderate global parenchymal brain volume loss. No hydrocephalus. Patchy to confluent supratentorial white matter FLAIR T2 hyperintensities. Prominent basal ganglia and thalami perivascular spaces associated with chronic small vessel ischemic disease. No midline shift, mass effect or masses. No abnormal extra-axial fluid collections. VASCULAR: Normal major intracranial vascular  flow voids present at skull base. SKULL AND UPPER CERVICAL SPINE: Partially empty sella. No suspicious calvarial bone marrow signal. Craniocervical junction maintained. SINUSES/ORBITS: Included paranasal sinuses are well aerated. Imaged mastoid air cells are well aerated. The included ocular globes and orbital contents are non-suspicious. Status post bilateral ocular lens implants. OTHER: None. IMPRESSION: 1. No acute intracranial process on this motion degraded examination. 2. Moderate to severe white matter changes seen with chronic small vessel ischemic disease. 3. Mild to moderate parenchymal brain volume loss, advanced for age. 4. Partially empty sella. Electronically Signed   By: Awilda Metro M.D.   On: 05/01/2017 19:58   Ct Head Code Stroke Wo Contrast  Result Date: 05/01/2017 CLINICAL DATA:  Code stroke. LEFT leg weakness after using toilet this morning. History of hypertension and hypercholesterolemia. EXAM: CT HEAD WITHOUT CONTRAST TECHNIQUE: Contiguous axial images were obtained from the base of the skull through the vertex without intravenous contrast. COMPARISON:  None. FINDINGS: BRAIN: No intraparenchymal hemorrhage, mass effect nor midline shift. Moderate global parenchymal brain volume loss. No hydrocephalus. Patchy to confluent supratentorial white matter hypodensities. No acute large vascular territory infarcts. No abnormal extra-axial fluid collections. Basal cisterns are patent. VASCULAR: Mild to moderate calcific atherosclerosis of the carotid siphons. No dense intracranial vessels. SKULL: No skull fracture. Partially empty sella. No significant scalp soft tissue swelling. SINUSES/ORBITS: The mastoid air-cells and included paranasal sinuses are well-aerated.The included ocular globes and orbital contents are non-suspicious. OTHER: None. ASPECTS [facility] Stroke Program Early CT Score) - Ganglionic level infarction (caudate, lentiform nuclei, internal capsule, insula, M1-M3 cortex): 7 -  Supraganglionic infarction (M4-M6 cortex): 3 Total score (0-10 with 10 being normal): 10 IMPRESSION: 1. No acute intracranial process. 2. Moderate parenchymal brain volume loss, advanced for age. Moderate to severe chronic small vessel ischemic disease. 3. ASPECTS is 10. 4. Critical Value/emergent results were called by telephone at the time of interpretation on 05/01/2017 at 2:21 pm to Dr. Vanetta Mulders , who verbally acknowledged  these results. Electronically Signed   By: Awilda Metro M.D.   On: 05/01/2017 14:21    Scheduled Meds: . amLODipine  5 mg Oral Daily   And  . benazepril  20 mg Oral Daily  . aspirin  300 mg Rectal Daily   Or  . aspirin  325 mg Oral Daily  . [START ON 05/03/2017] atorvastatin  80 mg Oral q1800  . enoxaparin (LOVENOX) injection  60 mg Subcutaneous Q24H  . hydrochlorothiazide  25 mg Oral Daily  . levothyroxine  125 mcg Oral QAC breakfast   Continuous Infusions: PRN Meds: acetaminophen **OR** acetaminophen (TYLENOL) oral liquid 160 mg/5 mL **OR** acetaminophen, senna-docusate  Time spent: 35 minutes  Author: Lynden Oxford, MD [facility] Pager: [phone] 05/02/2017 2:08 PM  If 7PM-7AM, please contact night-coverage at [email or web address], password [email or web address]

## 2017-05-02 NOTE — Progress Notes (Signed)
  Echocardiogram 2D Echocardiogram has been performed.  Janalyn HarderWest, Wanetta Funderburke R 05/02/2017, 4:30 PM

## 2017-05-03 DIAGNOSIS — E785 Hyperlipidemia, unspecified: Secondary | ICD-10-CM | POA: Diagnosis not present

## 2017-05-03 DIAGNOSIS — I10 Essential (primary) hypertension: Secondary | ICD-10-CM | POA: Diagnosis not present

## 2017-05-03 DIAGNOSIS — G459 Transient cerebral ischemic attack, unspecified: Secondary | ICD-10-CM | POA: Diagnosis not present

## 2017-05-03 MED ORDER — ATORVASTATIN CALCIUM 80 MG PO TABS: 80.0000 mg | ORAL_TABLET | Freq: Every day | ORAL | 0 refills | Status: AC

## 2017-05-03 MED ORDER — ASPIRIN 325 MG PO TABS: 325.0000 mg | ORAL_TABLET | Freq: Every day | ORAL | 0 refills | Status: AC

## 2017-05-03 NOTE — Care Management Note (Signed)
Case Management Note  Patient Details  Name: Nancy GantDelphine Delores Houston MRN: [id number] Date of Birth: 04-Feb-1951  Subjective/Objective:                    Action/Plan: [facility]. Pt has orders for walker and 3 in 1. [facility]up[facility]Donna [facility] informed and will deliver to the room. Pt has transportation home.   Expected Discharge Date:  05/03/17               Expected Discharge Plan:  Home w Home Health Services  In-House Referral:  NA  Discharge planning Services  CM Consult  Post Acute Care Choice:  Home Health Choice offered to:  Patient  DME Arranged:  Walker rolling, 3-N-1 DME Agency:  [facility]  HH Arranged:  PT, OT [facility]  Status of Service:  Completed, signed off  If discussed at Long Length of Stay Meetings, dates discussed:    Additional Comments:  Kermit BaloKelli F Zonya Gudger, RN 05/03/2017, 12:13 PM

## 2017-05-03 NOTE — Discharge Summary (Signed)
Physician Discharge Summary  Nancy Houston  VWU:[id number]  DOB: 08-15-1950  DOA: 05/01/2017 PCP: Domenic Schwab, FNP  Admit date: 05/01/2017 Discharge date: 05/03/2017  Admitted From: Home  Disposition: Home   Recommendations for Outpatient Follow-up:  1. Follow up with PCP in 1-2 weeks 2. Follow up with neurology in 6 weeks  3. Please obtain BMP/CBC in one week 4.   Home Health: PT/OT  Equipment/Devices: Walker   Discharge Condition: Stable  CODE STATUS: Full Code  Diet recommendation: Heart Healthy   Brief/Interim Summary: For full details see H&P/progress note but in brief, Nancy Houston is a 67 year old female with past medical history of hypertension hyperlipidemia and active smoker who presented to the emergency department on 1/21 2019 with complaints of left leg weakness.  Patient was admitted for stroke/TIA workup.  Neurology was consulted, workup was unremarkable negative for acute stroke therefore symptoms were attributed to TIA.  Neurology recommended aggressive risk factor management and lifestyle medication.  Patient subsequently improved and clinically back to baseline. Patient deemed stable for discharge and follow-up as an outpatient.  Subjective: Patient seen and examined, she denies any weakness and numbness.  Patient work with PT and OT recommending some home health physical therapy.  Denies chest pain, shortness of breath and palpitation.  Discharge Diagnoses/Hospital Course:  TIA Symptoms already resolved  MRI brain with no acute abnormalities, some incidental findings listed below Echo EF 60-65%, grade 1 diastolic dysfunction, no PFO Lipitor increased to 80 mg daily, continue aspirin PT recommending home health aide MRI of the lumbar spine with any acute abnormalities, mild Neural foraminal narrowing at the level of L2-3 through L5-S1.  Felt to be causing back pain  Hypertension BP medications were held to allow permissive hypertension. BP  stable during hospital stay Resume home medications without any changes  Hyperlipidemia Lipitor was increased to 80 mg daily Continue aggressive risk factor modification  Right carotid stenosis -up to 50% Outpatient follow-up  4 mm right PICA aneurysm Outpatient surveillance with neurology  3 cm left parotid mass Follow-up with PCP may need ENT evaluation No comments on images regarding mass appearing  Tobacco abuse Tobacco cessation discussed  Morbid obesity Follow-up with PCP  All other chronic medical condition were stable during the hospitalization.  Patient was seen by physical therapy, recommending home health PT On the day of the discharge the patient's vitals were stable, and no other acute medical condition were reported by patient. the patient was felt safe to be discharge to home  Discharge Instructions  You were cared for by a hospitalist during your hospital stay. If you have any questions about your discharge medications or the care you received while you were in the hospital after you are discharged, you can call the unit and asked to speak with the hospitalist on call if the hospitalist that took care of you is not available. Once you are discharged, your primary care physician will handle any further medical issues. Please note that NO REFILLS for any discharge medications will be authorized once you are discharged, as it is imperative that you return to your primary care physician (or establish a relationship with a primary care physician if you do not have one) for your aftercare needs so that they can reassess your need for medications and monitor your lab values.  Discharge Instructions    Ambulatory referral to Neurology   Complete by:  As directed    An appointment is requested in approximately:  Weeks Follow up with  stroke clinic (Dr Pearlean Brownie preferred, if not available, then consider Sylvie Farrier, [facility] or Lucia Gaskins whoever is available) at [facility] in about 6-8  weeks. Thanks.   Call MD for:  difficulty breathing, headache or visual disturbances   Complete by:  As directed    Call MD for:  extreme fatigue   Complete by:  As directed    Call MD for:  hives   Complete by:  As directed    Call MD for:  persistant dizziness or light-headedness   Complete by:  As directed    Call MD for:  persistant nausea and vomiting   Complete by:  As directed    Call MD for:  redness, tenderness, or signs of infection (pain, swelling, redness, odor or green/yellow discharge around incision site)   Complete by:  As directed    Call MD for:  severe uncontrolled pain   Complete by:  As directed    Call MD for:  temperature >100.4   Complete by:  As directed    Diet - low sodium heart healthy   Complete by:  As directed    Increase activity slowly   Complete by:  As directed      Allergies as of 05/03/2017      Reactions   Penicillins    unknown      Medication List    TAKE these medications   amLODipine-benazepril 5-20 MG capsule Commonly known as:  LOTREL Take 1 capsule by mouth daily.   aspirin 325 MG tablet Take 1 tablet (325 mg total) by mouth daily. Start taking on:  05/04/2017   atorvastatin 80 MG tablet Commonly known as:  LIPITOR Take 1 tablet (80 mg total) by mouth daily at 6 PM. What changed:    medication strength  how much to take  when to take this   hydrochlorothiazide 25 MG tablet Commonly known as:  HYDRODIURIL Take 25 mg by mouth daily.   SYNTHROID 125 MCG tablet Generic drug:  levothyroxine Take 125 mcg by mouth daily.            Durable Medical Equipment  (From admission, onward)        Start     Ordered   05/03/17 1057  For home use only DME 3 n 1  Once     05/03/17 1057   05/02/17 1150  For home use only DME Walker rolling  Once    Question:  Patient needs a walker to treat with the following condition  Answer:  Weakness   05/02/17 1150     Follow-up Information    Micki Riley, MD. Schedule an  appointment as soon as possible for a visit in 6 week(s).   Specialties:  Neurology, Radiology Contact information: [address] [place] [address] [phone]        [facility] Follow up.   Why:  Home Health Physical Therapy-agency will call to arrange initial appointment Contact information: [address] [place] [address] [phone]       Domenic Schwab, FNP. Schedule an appointment as soon as possible for a visit in 1 week(s).   Why:  Hospital follow up  Contact information: [address] [place] [address] [phone]          Allergies  Allergen Reactions  . Penicillins     unknown    Consultations:  Neurology    Procedures/Studies: Ct Angio Head W Or Wo Contrast  Result Date: 05/01/2017 CLINICAL  DATA:  Initial evaluation for acute left leg weakness. EXAM: CT ANGIOGRAPHY HEAD AND NECK TECHNIQUE: Multidetector CT imaging of the head and neck was performed using the standard protocol during bolus administration of intravenous contrast. Multiplanar CT image reconstructions and MIPs were obtained to evaluate the vascular anatomy. Carotid stenosis measurements (when applicable) are obtained utilizing NASCET criteria, using the distal internal carotid diameter as the denominator. CONTRAST:  50mL ISOVUE-370 IOPAMIDOL (ISOVUE-370) INJECTION 76% COMPARISON:  Comparison made with prior CT and MRI from earlier the same day. FINDINGS: CTA NECK FINDINGS Aortic arch: Visualized aortic arch of normal caliber with normal branch pattern. Scattered atheromatous plaque about the arch itself and origin of the great vessels without flow-limiting stenosis. Visualized subclavian arteries widely patent. Right carotid system: Right common carotid artery the tortuous proximally but widely patent to the bifurcation. Calcified plaque about the right carotid bifurcation with associated stenosis of up to 50% by NASCET criteria. Right ICA  widely patent distally to the skull base without stenosis, dissection, or occlusion. Left carotid system: Left common carotid artery tortuous but widely patent to the bifurcation without stenosis. Mild atheromatous plaque about the left bifurcation without significant narrowing. Left ICA widely patent to the skull base without stenosis, dissection, or occlusion. Vertebral arteries: Both of the vertebral arteries arise from the subclavian arteries. Vertebral arteries are tortuous proximally but widely patent to the skull base without stenosis, dissection, or occlusion. Skeleton: No acute osseus abnormality. No worrisome lytic or blastic osseous lesions. Mild-to-moderate spondylolysis noted at C6-7. Other neck: No acute soft tissue abnormality within the neck. Subcentimeter dystrophic calcification noted within left lobe of thyroid. No adenopathy. Fairly well-circumscribed mildly hyperdense mass position within the superficial lobe of the left parotid gland measures 2.1 x 1.8 x 3.1 cm (series 5, image 89). Few associated subcentimeter calcifications. Upper chest: Visualized upper chest within normal limits. Partially visualized lungs are clear. Review of the MIP images confirms the above findings CTA HEAD FINDINGS Anterior circulation: Petrous segments widely patent bilaterally. Scattered calcified plaque within the cavernous/supraclinoid ICAs with mild to moderate multifocal narrowing. ICA termini widely patent. A1 segments mildly irregular but patent. Normal anterior communicating artery. Anterior cerebral arteries widely patent to their distal aspects without stenosis. M1 segments demonstrate mild atheromatous irregularity but are widely patent without stenosis. No proximal M2 occlusion distal MCA branches well perfused and symmetric. Distal small vessel atheromatous irregularity. Posterior circulation: Vertebral arteries widely patent to the vertebrobasilar junction without flow-limiting stenosis. Posterior  inferior cerebral arteries patent bilaterally. 4 x 4 x 4 mm focal outpouching arising at the takeoff of the right PICA consistent with a small aneurysm. Posterior inferior cerebral arteries patent bilaterally. Basilar artery widely patent to its distal aspect. Superior cerebral arteries patent bilaterally. Both of the posterior cerebral artery supplied via the basilar and are well perfused to their distal aspects. Venous sinuses: Patent. Anatomic variants: None significant. Delayed phase: No abnormal enhancement. Review of the MIP images confirms the above findings IMPRESSION: 1. Negative CTA for large vessel occlusion. 2. Atheromatous plaque about the right carotid bifurcation with associated short-segment stenosis of up to 50% by NASCET criteria. 3. No other high-grade or correctable stenosis about the major arterial vasculature of the head and neck. 4. 4 mm right PICA aneurysm. 5. Approximate 3 cm left parotid mass. Nonemergent ENT consultation suggested for further evaluation. Electronically Signed   By: Rise MuBenjamin  McClintock M.D.   On: 05/01/2017 23:49   Ct Angio Neck W Or Wo Contrast  Result Date: 05/01/2017 CLINICAL  DATA:  Initial evaluation for acute left leg weakness. EXAM: CT ANGIOGRAPHY HEAD AND NECK TECHNIQUE: Multidetector CT imaging of the head and neck was performed using the standard protocol during bolus administration of intravenous contrast. Multiplanar CT image reconstructions and MIPs were obtained to evaluate the vascular anatomy. Carotid stenosis measurements (when applicable) are obtained utilizing NASCET criteria, using the distal internal carotid diameter as the denominator. CONTRAST:  50mL ISOVUE-370 IOPAMIDOL (ISOVUE-370) INJECTION 76% COMPARISON:  Comparison made with prior CT and MRI from earlier the same day. FINDINGS: CTA NECK FINDINGS Aortic arch: Visualized aortic arch of normal caliber with normal branch pattern. Scattered atheromatous plaque about the arch itself and origin of  the great vessels without flow-limiting stenosis. Visualized subclavian arteries widely patent. Right carotid system: Right common carotid artery the tortuous proximally but widely patent to the bifurcation. Calcified plaque about the right carotid bifurcation with associated stenosis of up to 50% by NASCET criteria. Right ICA widely patent distally to the skull base without stenosis, dissection, or occlusion. Left carotid system: Left common carotid artery tortuous but widely patent to the bifurcation without stenosis. Mild atheromatous plaque about the left bifurcation without significant narrowing. Left ICA widely patent to the skull base without stenosis, dissection, or occlusion. Vertebral arteries: Both of the vertebral arteries arise from the subclavian arteries. Vertebral arteries are tortuous proximally but widely patent to the skull base without stenosis, dissection, or occlusion. Skeleton: No acute osseus abnormality. No worrisome lytic or blastic osseous lesions. Mild-to-moderate spondylolysis noted at C6-7. Other neck: No acute soft tissue abnormality within the neck. Subcentimeter dystrophic calcification noted within left lobe of thyroid. No adenopathy. Fairly well-circumscribed mildly hyperdense mass position within the superficial lobe of the left parotid gland measures 2.1 x 1.8 x 3.1 cm (series 5, image 89). Few associated subcentimeter calcifications. Upper chest: Visualized upper chest within normal limits. Partially visualized lungs are clear. Review of the MIP images confirms the above findings CTA HEAD FINDINGS Anterior circulation: Petrous segments widely patent bilaterally. Scattered calcified plaque within the cavernous/supraclinoid ICAs with mild to moderate multifocal narrowing. ICA termini widely patent. A1 segments mildly irregular but patent. Normal anterior communicating artery. Anterior cerebral arteries widely patent to their distal aspects without stenosis. M1 segments demonstrate  mild atheromatous irregularity but are widely patent without stenosis. No proximal M2 occlusion distal MCA branches well perfused and symmetric. Distal small vessel atheromatous irregularity. Posterior circulation: Vertebral arteries widely patent to the vertebrobasilar junction without flow-limiting stenosis. Posterior inferior cerebral arteries patent bilaterally. 4 x 4 x 4 mm focal outpouching arising at the takeoff of the right PICA consistent with a small aneurysm. Posterior inferior cerebral arteries patent bilaterally. Basilar artery widely patent to its distal aspect. Superior cerebral arteries patent bilaterally. Both of the posterior cerebral artery supplied via the basilar and are well perfused to their distal aspects. Venous sinuses: Patent. Anatomic variants: None significant. Delayed phase: No abnormal enhancement. Review of the MIP images confirms the above findings IMPRESSION: 1. Negative CTA for large vessel occlusion. 2. Atheromatous plaque about the right carotid bifurcation with associated short-segment stenosis of up to 50% by NASCET criteria. 3. No other high-grade or correctable stenosis about the major arterial vasculature of the head and neck. 4. 4 mm right PICA aneurysm. 5. Approximate 3 cm left parotid mass. Nonemergent ENT consultation suggested for further evaluation. Electronically Signed   By: Rise Mu M.D.   On: 05/01/2017 23:49   Mr Brain Wo Contrast (neuro Protocol)  Result Date: 05/01/2017 CLINICAL DATA:  LEFT leg weakness after bowel movement. History of hypertension and hypercholesterolemia. EXAM: MRI HEAD WITHOUT CONTRAST TECHNIQUE: Multiplanar, multiecho pulse sequences of the brain and surrounding structures were obtained without intravenous contrast. COMPARISON:  CT HEAD May 01, 2017 at 1412 hours FINDINGS: Multiple sequences are moderately or severely motion degraded. BRAIN: No reduced diffusion to suggest acute ischemia. Mild-to-moderate global  parenchymal brain volume loss. No hydrocephalus. Patchy to confluent supratentorial white matter FLAIR T2 hyperintensities. Prominent basal ganglia and thalami perivascular spaces associated with chronic small vessel ischemic disease. No midline shift, mass effect or masses. No abnormal extra-axial fluid collections. VASCULAR: Normal major intracranial vascular flow voids present at skull base. SKULL AND UPPER CERVICAL SPINE: Partially empty sella. No suspicious calvarial bone marrow signal. Craniocervical junction maintained. SINUSES/ORBITS: Included paranasal sinuses are well aerated. Imaged mastoid air cells are well aerated. The included ocular globes and orbital contents are non-suspicious. Status post bilateral ocular lens implants. OTHER: None. IMPRESSION: 1. No acute intracranial process on this motion degraded examination. 2. Moderate to severe white matter changes seen with chronic small vessel ischemic disease. 3. Mild to moderate parenchymal brain volume loss, advanced for age. 4. Partially empty sella. Electronically Signed   By: Awilda Metro M.D.   On: 05/01/2017 19:58   Mr Lumbar Spine Wo Contrast  Result Date: 05/02/2017 CLINICAL DATA:  LEFT leg weakness after using toilet. History of hypertension and hypercholesterolemia. EXAM: MRI LUMBAR SPINE WITHOUT CONTRAST TECHNIQUE: Multiplanar, multisequence MR imaging of the lumbar spine was performed. No intravenous contrast was administered. COMPARISON:  None. FINDINGS: Mild motion degraded examination. SEGMENTATION: For the purposes of this report, the last well-formed intervertebral disc is reported as L5-S1. ALIGNMENT: Maintained lumbar lordosis. No malalignment. VERTEBRAE:Vertebral bodies are intact. Intervertebral discs demonstrate normal morphology and signal characteristics. Multilevel mild chronic discogenic endplate changes. Moderate T11-12 disc height loss and endplate spurring compatible with degenerative disc. Associated mild canal  stenosis. Heterogeneously bright bone marrow signal, possible osteopenia. No acute bone marrow signal. CONUS MEDULLARIS AND CAUDA EQUINA: Conus medullaris terminates at L1-2 and demonstrates normal morphology and signal characteristics. Trace T1 shortening along the filum terminale with chemical shift artifact compatible with fibro lipomatosis changes without cord tethering. PARASPINAL AND OTHER SOFT TISSUES: Nonacute. 17 mm partially imaged T2 bright cyst RIGHT kidney. Moderate symmetric paraspinal muscle atrophy. DISC LEVELS: L1-2: No disc bulge, canal stenosis nor neural foraminal narrowing. Moderate facet arthropathy. L2-3: Annular bulging. Moderate facet arthropathy without canal stenosis. Minimal neural foraminal narrowing. L3-4: Annular bulging, mild-to-moderate facet arthropathy. No canal stenosis. Minimal neural foraminal narrowing. L4-5: Annular bulging. Moderate to severe facet arthropathy. No canal stenosis. Mild RIGHT and minimal LEFT neural foraminal narrowing. L5-S1: Annular bulging. Severe facet arthropathy without canal stenosis. Mild bilateral neural foraminal narrowing. IMPRESSION: 1. No fracture, malalignment or acute osseous process on this mildly motion degraded examination. 2. Nonspecific heterogeneous bone marrow signal associated with anemia or osteopenia. 3. No canal stenosis. Minimal to mild L2-3 through L5-S1 neural foraminal narrowing. Electronically Signed   By: Awilda Metro M.D.   On: 05/02/2017 21:48   Ct Head Code Stroke Wo Contrast  Result Date: 05/01/2017 CLINICAL DATA:  Code stroke. LEFT leg weakness after using toilet this morning. History of hypertension and hypercholesterolemia. EXAM: CT HEAD WITHOUT CONTRAST TECHNIQUE: Contiguous axial images were obtained from the base of the skull through the vertex without intravenous contrast. COMPARISON:  None. FINDINGS: BRAIN: No intraparenchymal hemorrhage, mass effect nor midline shift. Moderate global parenchymal brain volume  loss. No hydrocephalus. Patchy to  confluent supratentorial white matter hypodensities. No acute large vascular territory infarcts. No abnormal extra-axial fluid collections. Basal cisterns are patent. VASCULAR: Mild to moderate calcific atherosclerosis of the carotid siphons. No dense intracranial vessels. SKULL: No skull fracture. Partially empty sella. No significant scalp soft tissue swelling. SINUSES/ORBITS: The mastoid air-cells and included paranasal sinuses are well-aerated.The included ocular globes and orbital contents are non-suspicious. OTHER: None. ASPECTS [facility] Stroke Program Early CT Score) - Ganglionic level infarction (caudate, lentiform nuclei, internal capsule, insula, M1-M3 cortex): 7 - Supraganglionic infarction (M4-M6 cortex): 3 Total score (0-10 with 10 being normal): 10 IMPRESSION: 1. No acute intracranial process. 2. Moderate parenchymal brain volume loss, advanced for age. Moderate to severe chronic small vessel ischemic disease. 3. ASPECTS is 10. 4. Critical Value/emergent results were called by telephone at the time of interpretation on 05/01/2017 at 2:21 pm to Dr. Vanetta Mulders , who verbally acknowledged these results. Electronically Signed   By: Awilda Metro M.D.   On: 05/01/2017 14:21   ECHO: 05/02/17  ------------------------------------------------------------------- Study Conclusions  - Left ventricle: The cavity size was normal. There was severe   concentric hypertrophy. Systolic function was normal. The   estimated ejection fraction was in the range of 60% to 65%. There   was dynamic obstruction at restin the mid cavity, with a peak   gradient of 9 mm Hg. There was dynamic obstruction during   V[place]ain the mid cavity, with a peak gradient of 23 mm Hg. Wall   motion was normal; there were no regional wall motion   abnormalities. There was an increased relative contribution of   atrial contraction to ventricular filling. Doppler parameters are   consistent  with abnormal left ventricular relaxation (grade 1   diastolic dysfunction). - Aortic valve: Trileaflet; mildly thickened, mildly calcified   leaflets. - Mitral valve: There was mild regurgitation. - Left atrium: The atrium was mildly dilated. - Pulmonary arteries: Systolic pressure could not be accurately   estimated. - Inferior vena cava: The vessel was mildly dilated. - Pericardium, extracardiac: A trivial pericardial effusion was   identified posterior to the heart.   Discharge Exam: Vitals:   05/03/17 0522 05/03/17 1026  BP: (!) 113/59 (!) 119/58  Pulse: 87 77  Resp: 18 17  Temp: 97.9 F (36.6 C) 98.2 F (36.8 C)  SpO2: 96% 95%   Vitals:   05/02/17 2246 05/03/17 0138 05/03/17 0522 05/03/17 1026  BP: 128/71 124/72 (!) 113/59 (!) 119/58  Pulse: 99 96 87 77  Resp: 20 18 18 17   Temp: 99.1 F (37.3 C) 98.3 F (36.8 C) 97.9 F (36.6 C) 98.2 F (36.8 C)  TempSrc: Oral Oral Oral Oral  SpO2: 95% 95% 96% 95%  Weight:      Height:        General: Pt is alert, awake, not in acute distress Cardiovascular: RRR, S1/S2 +, no rubs, no gallops Respiratory: CTA bilaterally, no wheezing, no rhonchi Abdominal: Soft, NT, ND, bowel sounds + Extremities: no edema, no cyanosis   The results of significant diagnostics from this hospitalization (including imaging, microbiology, ancillary and laboratory) are listed below for reference.     Microbiology: No results found for this or any previous visit (from the past 240 hour(s)).   Labs: BNP (last 3 results) No results for input(s): BNP in the last 8760 hours. Basic Metabolic Panel: Recent Labs  Lab 05/01/17 1355  NA 139  K 3.4*  CL 106  CO2 27  GLUCOSE 109*  BUN 16  CREATININE  0.80  CALCIUM 9.3   Liver Function Tests: Recent Labs  Lab 05/01/17 1355  AST 28  ALT 31  ALKPHOS 113  BILITOT 0.5  PROT 7.1  ALBUMIN 3.6   No results for input(s): LIPASE, AMYLASE in the last 168 hours. No results for input(s):  AMMONIA in the last 168 hours. CBC: Recent Labs  Lab 05/01/17 1355  WBC 7.2  NEUTROABS 5.1  HGB 12.9  HCT 39.1  MCV 81.6  PLT 199   Cardiac Enzymes: Recent Labs  Lab 05/01/17 1355  TROPONINI <0.03   BNP: Invalid input(s): POCBNP CBG: Recent Labs  Lab 05/01/17 1358  GLUCAP 96   D-Dimer No results for input(s): DDIMER in the last 72 hours. Hgb A1c Recent Labs    05/02/17 0158  HGBA1C 6.3*   Lipid Profile Recent Labs    05/02/17 0158  CHOL 135  HDL 28*  LDLCALC 82  TRIG 409  CHOLHDL 4.8   Thyroid function studies No results for input(s): TSH, T4TOTAL, T3FREE, THYROIDAB in the last 72 hours.  Invalid input(s): FREET3 Anemia work up No results for input(s): VITAMINB12, FOLATE, FERRITIN, TIBC, IRON, RETICCTPCT in the last 72 hours. Urinalysis    Component Value Date/Time   COLORURINE YELLOW 05/01/2017 1453   APPEARANCEUR CLEAR 05/01/2017 1453   LABSPEC 1.015 05/01/2017 1453   PHURINE 7.5 05/01/2017 1453   GLUCOSEU NEGATIVE 05/01/2017 1453   HGBUR NEGATIVE 05/01/2017 1453   BILIRUBINUR NEGATIVE 05/01/2017 1453   KETONESUR NEGATIVE 05/01/2017 1453   PROTEINUR NEGATIVE 05/01/2017 1453   NITRITE NEGATIVE 05/01/2017 1453   LEUKOCYTESUR NEGATIVE 05/01/2017 1453   Sepsis Labs Invalid input(s): PROCALCITONIN,  WBC,  LACTICIDVEN Microbiology No results found for this or any previous visit (from the past 240 hour(s)).   Time coordinating discharge: 32 minutes  SIGNED:  Latrelle Dodrill, MD  [facility] 05/03/2017, 12:01 PM  Pager please text page via  [email or web address]

## 2017-05-03 NOTE — Progress Notes (Signed)
Occupational Therapy Treatment Patient Details Name: Nancy Houston MRN: [id number] DOB: Aug 21, 1950 Today's Date: 05/03/2017    History of present illness Nancy Houston is a 67 y.o. female with medical history significant of HTN, HLD, tobacco abuse.  Patient presents to the ED at [facility] with c/o transient left leg weakness.  Got up and walked to bathroom without difficulty.  Urinated.  Had diffuse weakness of left leg when she tried to stand up.  No numbness nor tingling, no numbness.  No pain.  Never had anything like this before.  Steady improvement over course of day now close to baseline.   OT comments  Pt progressing towards established goals. Providing education and handout on tub transfer, and pt performing with Min A for safety and balance. Pt continues to demonstrate decreased ROM and strength in LLE. Educated pt on LB dressing; pt verbalized understanding. Providing pt with education and handout on fall prevention; pt verbalized understanding. Continue to recommend dc home with HHOT and will follow as admitted to facilitate safe dc.    Follow Up Recommendations  Home health OT;Supervision/Assistance - 24 hour    Equipment Recommendations  3 in 1 bedside commode    Recommendations for Other Services PT consult    Precautions / Restrictions Precautions Precautions: Fall Restrictions Weight Bearing Restrictions: No       Mobility Bed Mobility Overal bed mobility: Modified Independent             General bed mobility comments: Increased time  Transfers Overall transfer level: Needs assistance Equipment used: Rolling walker (2 wheeled) Transfers: Sit to/from Stand Sit to Stand: Min guard         General transfer comment: Min Guard for safety    Balance Overall balance assessment: Needs assistance Sitting-balance support: Feet supported;No upper extremity supported Sitting balance-Leahy Scale: Good     Standing balance support: During  functional activity;Bilateral upper extremity supported Standing balance-Leahy Scale: Fair                             ADL either performed or assessed with clinical judgement   ADL Overall ADL's : Needs assistance/impaired                       Lower Body Dressing Details (indicate cue type and reason): Educated pt on LB dressing and dressing LLE first. Pt verbalized understanding.          Tub/ Shower Transfer: Tub transfer;Minimal assistance;Cueing for safety;Cueing for sequencing;Ambulation;3 in 1;Rolling walker Tub/Shower Transfer Details (indicate cue type and reason): Providing education and handout on safe tub transfer. Pt performing tansfer with Min A for safety and Mod cues for safe sequencing. Pt continues to demonstrate decreased ROM for LLE.  Functional mobility during ADLs: Min guard;Rolling walker General ADL Comments: Providing education on tub trnasfer, LB dressing, and fall prevention. Reviewed fall prevention handout; pt verbalized understanding.      Vision       Perception     Praxis      Cognition Arousal/Alertness: Awake/alert Behavior During Therapy: WFL for tasks assessed/performed Overall Cognitive Status: Within Functional Limits for tasks assessed                                          Exercises     Shoulder Instructions  General Comments      Pertinent Vitals/ Pain       Pain Assessment: No/denies pain  Home Living                                          Prior Functioning/Environment              Frequency  Min 2X/week        Progress Toward Goals  OT Goals(current goals can now be found in the care plan section)  Progress towards OT goals: Progressing toward goals  Acute Rehab OT Goals Patient Stated Goal: Go home today OT Goal Formulation: With patient Time For Goal Achievement: 05/16/17 Potential to Achieve Goals: Good ADL Goals Pt Will Perform Lower  Body Dressing: with modified independence;sit to/from stand Pt Will Transfer to Toilet: with modified independence;ambulating;bedside commode Pt Will Perform Tub/Shower Transfer: Tub transfer;with modified independence;3 in 1;rolling walker;ambulating Additional ADL Goal #1: Pt will verbalize three fall prevention strategies with 1-2 VCs  Plan Discharge plan remains appropriate    Co-evaluation                 AM-PAC PT "6 Clicks" Daily Activity     Outcome Measure   Help from another person eating meals?: None Help from another person taking care of personal grooming?: A Little Help from another person toileting, which includes using toliet, bedpan, or urinal?: A Little Help from another person bathing (including washing, rinsing, drying)?: A Little Help from another person to put on and taking off regular upper body clothing?: None Help from another person to put on and taking off regular lower body clothing?: A Little 6 Click Score: 20    End of Session Equipment Utilized During Treatment: Gait belt;Rolling walker  OT Visit Diagnosis: Unsteadiness on feet (R26.81);Other abnormalities of gait and mobility (R26.89);Muscle weakness (generalized) (M62.81)   Activity Tolerance Patient tolerated treatment well   Patient Left in bed;with call bell/phone within reach   Nurse Communication Mobility status        Time: [id number] OT Time Calculation (min): 22 min  Charges: OT General Charges $OT Visit: 1 Visit OT Treatments $Self Care/Home Management : 8-22 mins  Muzammil Bruins MSOT, OTR/L Acute Rehab Pager: [phone] Office: [phone]   Theodoro GristCharis M Ancil Houston 05/03/2017, 10:29 AM

## 2017-05-03 NOTE — Progress Notes (Addendum)
NEUROHOSPITALISTS STROKE TEAM - DAILY PROGRESS NOTE   ADMISSION HISTORY: Nancy Houston is a 67 y.o. female with a history of hypertension, hyperlipidemia, tobacco abuse who presents with transient left leg weakness.  She got up and walked to the bathroom without difficulty, she then urinated, was not sitting down for long, and when she attempted to stand she had diffuse weakness of her left leg.  She states that she can barely wiggle her toes, but could not move much at her ankle or her hip or knee.  She denies significant numbness.  She denies arm involvement.  She states that it has been steadily improving over the course of the day, now close to her baseline.  LKW: 10:00 AM tpa given?: no, symptoms improved Premorbid modified rankin scale: 0  SUBJECTIVE (INTERVAL HISTORY) No family is at the bedside. Patient is found laying in bed in NAD. Overall she feels her condition is improved. Voices no new complaints. No new events reported overnight. Patient states she was again able to ambulate in hallway with PT this AM. Plan to d/c home later today   OBJECTIVE Lab Results: CBC:  Recent Labs  Lab 05/01/17 1355  WBC 7.2  HGB 12.9  HCT 39.1  MCV 81.6  PLT 199   BMP: Recent Labs  Lab 05/01/17 1355  NA 139  K 3.4*  CL 106  CO2 27  GLUCOSE 109*  BUN 16  CREATININE 0.80  CALCIUM 9.3   Liver Function Tests:  Recent Labs  Lab 05/01/17 1355  AST 28  ALT 31  ALKPHOS 113  BILITOT 0.5  PROT 7.1  ALBUMIN 3.6   Cardiac Enzymes:  Recent Labs  Lab 05/01/17 1355  TROPONINI <0.03   Coagulation Studies:  Recent Labs    05/01/17 1355  APTT 33  INR 0.95   PHYSICAL EXAM Temp:  [97.9 F (36.6 C)-99.1 F (37.3 C)] 98.2 F (36.8 C) (01/23 1026) Pulse Rate:  [77-99] 77 (01/23 1026) Resp:  [17-20] 17 (01/23 1026) BP: (112-128)/(51-72) 119/58 (01/23 1026) SpO2:  [95 %-96 %] 95 % (01/23 1026) General - OBs  middle-aged African-American lady, in no apparent distress HEENT-  Normocephalic,  Cardiovascular - Regular rate and rhythm  Respiratory - Lungs clear bilaterally. No wheezing. Abdomen - soft and non-tender, BS normal Extremities- no edema or cyanosis Mental Status: Patient is awake, alert, oriented to person, place, month, year, and situation. Patient is able to give a clear and coherent history. No signs of aphasia or neglect Cranial Nerves: II: Visual Fields are full. Pupils are equal, round, and reactive to light.   III,IV, VI: EOMI without ptosis or diploplia.  V: Facial sensation is symmetric to temperature VII: Facial movement is symmetric.  VIII: hearing is intact to voice X: Uvula elevates symmetrically XI: Shoulder shrug is symmetric. XII: tongue is midline without atrophy or fasciculations.  Motor: Tone is normal. Bulk is normal. 5/5 strength was present in bilateral arms, and right leg. 4+/5 strength in  left leg both proximally and distally involving all muscles,  Sensory: Sensation is symmetric to light touch and temperature in the arms and legs. Deep Tendon Reflexes: 2+ and symmetric in the biceps and patellae.  1+ ankle bilaterally, reflexes more brisk on left side than right Cerebellar: FNF intact bilaterally  IMAGING: I have personally reviewed the radiological images below and agree with the radiology interpretations.  Ct Angio Head Neck W Or Wo Contrast Result Date: 05/01/2017 IMPRESSION: 1. Negative CTA for large vessel occlusion.  2. Atheromatous plaque about the right carotid bifurcation with associated short-segment stenosis of up to 50% by NASCET criteria. 3. No other high-grade or correctable stenosis about the major arterial vasculature of the head and neck. 4. 4 mm right PICA aneurysm. 5. Approximate 3 cm left parotid mass. Nonemergent ENT consultation suggested for further evaluation. Electronically Signed   By: Rise Mu M.D.   On: 05/01/2017  23:49   Mr Brain Wo Contrast (neuro Protocol) Result Date: 05/01/2017 IMPRESSION: 1. No acute intracranial process on this motion degraded examination. 2. Moderate to severe white matter changes seen with chronic small vessel ischemic disease. 3. Mild to moderate parenchymal brain volume loss, advanced for age. 4. Partially empty sella. Electronically Signed   By: Awilda Metro M.D.   On: 05/01/2017 19:58   Ct Head Code Stroke Wo Contrast Result Date: 05/01/2017 IMPRESSION: 1. No acute intracranial process. 2. Moderate parenchymal brain volume loss, advanced for age. Moderate to severe chronic small vessel ischemic disease. 3. ASPECTS is 10. 4. Critical Value/emergent results were called by telephone at the time of interpretation on 05/01/2017 at 2:21 pm to Dr. Vanetta Mulders , who verbally acknowledged these results. Electronically Signed   By: Awilda Metro M.D.   On: 05/01/2017 14:21   Echocardiogram:      Study Conclusions  - Left ventricle: The cavity size was normal. There was severe   concentric hypertrophy. Systolic function was normal. The   estimated ejection fraction was in the range of 60% to 65%. There   was dynamic obstruction at restin the mid cavity, with a peak   gradient of 9 mm Hg. There was dynamic obstruction during   Valsalvain the mid cavity, with a peak gradient of 23 mm Hg. Wall   motion was normal; there were no regional wall motion   abnormalities. There was an increased relative contribution of   atrial contraction to ventricular filling. Doppler parameters are   consistent with abnormal left ventricular relaxation (grade 1   diastolic dysfunction). - Aortic valve: Trileaflet; mildly thickened, mildly calcified   leaflets. - Mitral valve: There was mild regurgitation. - Left atrium: The atrium was mildly dilated. - Pulmonary arteries: Systolic pressure could not be accurately   estimated. - Inferior vena cava: The vessel was mildly dilated. -  Pericardium, extracardiac: A trivial pericardial effusion was   identified posterior to the heart.                                           MRI Lumbar Spine     IMPRESSION: 1. No fracture, malalignment or acute osseous process on this mildly motion degraded examination. 2. Nonspecific heterogeneous bone marrow signal associated with anemia or osteopenia. 3. No canal stenosis. Minimal to mild L2-3 through L5-S1 neural foraminal narrowing.                                           IMPRESSION: Ms. Eliyah Mcshea is a 67 y.o. female with PMH of HTN, HLD, Pre-Diabetes who presents with sudden onset Left leg weakness with rapid improvement. CT and MRI is negative for stroke. Patient likely suffered a small stroke not seen on MRI.  Likely right brain TIA:  Suspected Etiology: Likely small vessel disease Resultant Symptoms: Left leg weakness  Stroke Risk Factors: diabetes mellitus, hyperlipidemia and hypertension Other Stroke Risk Factors: Advanced age, Cigarette smoker, Morbid Obesity, Body mass index is 42.45 kg/m. , Family hx stroke   Outstanding Stroke Work-up Studies:    Workup completed  05/03/2017 ASSESSMENT:   Left leg weakness improved. Ambulated in hallway with PT this AM. MRI. Imaging and POC reviewed with patient and all questions answered. Likely to be discharged today.  PLAN  05/03/2017: Continue Aspirin/ Statin Frequent neuro checks Telemetry monitoring PT/OT/SLP Consult PM & Rehab Ongoing aggressive stroke risk factor management Patient counseled to be compliant with her antithrombotic medications Patient counseled on Lifestyle modifications including, Diet, Exercise, and Stress Follow up with [facility] in 6 weeks  right carotid stenosis of up to 50% Outpatient Neurology for surveillance  4 mm right PICA aneurysm Outpatient Neurology for surveillance  3 cm left parotid mass.  Outpatient ENT consultation for further evaluation.    HYPERTENSION: Stable Permissive hypertension (OK if <220/120) for 24-48 hours post stroke and then gradually normalized within 5-7 days. Long term BP goal normotensive. May slowly restart home B/P medications after 48 hours Home Meds: Lotrel, HCTZ  HYPERLIPIDEMIA:    Component Value Date/Time   CHOL 135 05/02/2017 0158   TRIG 127 05/02/2017 0158   HDL 28 (L) 05/02/2017 0158   CHOLHDL 4.8 05/02/2017 0158   VLDL 25 05/02/2017 0158   LDLCALC 82 05/02/2017 0158  Home Meds:  Lipitor mg  LDL  goal < 70 Increased to  Lipitor to 80 mg daily Continue statin at discharge  PRE-DIABETES: Lab Results  Component Value Date   HGBA1C 6.3 (H) 05/02/2017  HgbA1c goal < 7.0 Continue CBG monitoring and SSI to maintain glucose 140-180 mg/dl DM education   TOBACCO ABUSE  Current smoker Smoking cessation counseling provided Nicotine patch provided  OBESITY Morbid Obesity, Body mass index is 42.45 kg/m.   Other Active Problems: Principal Problem:   TIA (transient ischemic attack) Active Problems:   HTN (hypertension)    Hospital day # 0 VTE prophylaxis: Lovenox  Diet : Fall precautions Diet Heart Room service appropriate? Yes; Fluid consistency: Thin Diet - low sodium heart healthy   FAMILY UPDATES: family at bedside  TEAM UPDATES: Lenox Ponds, MD   Prior Home Stroke Medications:  No antithrombotic  Discharge Stroke Meds:  Please discharge patient on aspirin 325 mg daily   Disposition: Final discharge disposition not confirmed Therapy Recs:               HOME with [facility] PT/OT Follow Up:  Follow-up Information    Micki Riley, MD. Schedule an appointment as soon as possible for a visit in 6 week(s).   Specialties:  Neurology, Radiology Contact information: [address] [place] [address] [phone]        [facility] Follow up.   Why:  Home Health Physical Therapy-agency will call to arrange initial appointment Contact  information: [address] [place] [address] [phone]       Domenic Schwab, FNP. Schedule an appointment as soon as possible for a visit in 1 week(s).   Why:  Hospital follow up  Contact information: [address] [place] [address] [phone]          Domenic Schwab, FNP -PCP Follow up in 1-2 weeks      Assessment & plan discussed with with attending physician and they are in agreement.    Brita Romp Stroke Neurology Team 05/03/2017  12:27 PM t TIA and stroke risk, prevention and treatment discussion and answering questions Neurology to sign-off. Please call with any further questions or concerns. Thank you for this consultation I have personally examined this patient, reviewed notes, independently viewed imaging studies, participated in medical decision making and plan of care.ROS completed by me personally and pertinent positives fully documented  I have made any additions or clarifications directly to the above note. Agree with note above.  Delia Heady, MD Medical Director [facility] Pager: [phone] 05/03/2017 12:48 PM  To contact Stroke Continuity provider, please refer to [email or web address]. After hours, contact General Neurology

## 2017-05-03 NOTE — Progress Notes (Signed)
Pt being discharged from hospital per orders from MD. Pt educated on discharge instructions. Pt verbalized understanding of instructions. All questions and concerns were addressed. Pt's IV was removed prior to discharge. Pt exited hospital via wheelchair. 

## 2017-07-05 ENCOUNTER — Ambulatory Visit: Payer: Self-pay | Admitting: Neurology

## 2017-08-06 ENCOUNTER — Encounter (HOSPITAL_COMMUNITY): Payer: Self-pay | Admitting: Emergency Medicine

## 2017-08-06 ENCOUNTER — Emergency Department (HOSPITAL_COMMUNITY)
Admission: EM | Admit: 2017-08-06 | Discharge: 2017-08-07 | Disposition: A | Discharge status: Home / Self Care | Payer: Medicare Other | Attending: Emergency Medicine | Admitting: Emergency Medicine

## 2017-08-06 ENCOUNTER — Emergency Department (HOSPITAL_COMMUNITY): Payer: Medicare Other

## 2017-08-06 DIAGNOSIS — I10 Essential (primary) hypertension: Secondary | ICD-10-CM | POA: Insufficient documentation

## 2017-08-06 DIAGNOSIS — I639 Cerebral infarction, unspecified: Secondary | ICD-10-CM

## 2017-08-06 DIAGNOSIS — R4701 Aphasia: Secondary | ICD-10-CM | POA: Diagnosis present

## 2017-08-06 DIAGNOSIS — E039 Hypothyroidism, unspecified: Secondary | ICD-10-CM | POA: Insufficient documentation

## 2017-08-06 DIAGNOSIS — F17210 Nicotine dependence, cigarettes, uncomplicated: Secondary | ICD-10-CM | POA: Insufficient documentation

## 2017-08-06 DIAGNOSIS — Z7982 Long term (current) use of aspirin: Secondary | ICD-10-CM | POA: Insufficient documentation

## 2017-08-06 DIAGNOSIS — Z79899 Other long term (current) drug therapy: Secondary | ICD-10-CM | POA: Insufficient documentation

## 2017-08-06 LAB — COMPREHENSIVE METABOLIC PANEL
ALBUMIN: 3.2 g/dL — AB (ref 3.5–5.0)
ALT: 20 U/L (ref 14–54)
AST: 19 U/L (ref 15–41)
Alkaline Phosphatase: 102 U/L (ref 38–126)
Anion gap: 9 (ref 5–15)
BUN: 19 mg/dL (ref 6–20)
CHLORIDE: 109 mmol/L (ref 101–111)
CO2: 26 mmol/L (ref 22–32)
Calcium: 9 mg/dL (ref 8.9–10.3)
Creatinine, Ser: 1.06 mg/dL — ABNORMAL HIGH (ref 0.44–1.00)
GFR calc Af Amer: >60 mL/min (ref >60)
GFR calc non Af Amer: 54 mL/min — ABNORMAL LOW (ref >60)
GLUCOSE: 149 mg/dL — AB (ref 65–99)
POTASSIUM: 3.8 mmol/L (ref 3.5–5.1)
SODIUM: 144 mmol/L (ref 135–145)
Total Bilirubin: 0.5 mg/dL (ref 0.3–1.2)
Total Protein: 6.4 g/dL — ABNORMAL LOW (ref 6.5–8.1)

## 2017-08-06 LAB — DIFFERENTIAL
BASOS ABS: 0 10*3/uL (ref 0.0–0.1)
BASOS PCT: 0 %
Eosinophils Absolute: 0.2 10*3/uL (ref 0.0–0.7)
Eosinophils Relative: 2 %
Lymphocytes Relative: 31 %
Lymphs Abs: 2.4 10*3/uL (ref 0.7–4.0)
Monocytes Absolute: 0.4 10*3/uL (ref 0.1–1.0)
Monocytes Relative: 5 %
NEUTROS ABS: 4.9 10*3/uL (ref 1.7–7.7)
NEUTROS PCT: 62 %

## 2017-08-06 LAB — CBC
HCT: 37.6 % (ref 36.0–46.0)
HEMOGLOBIN: 12 g/dL (ref 12.0–15.0)
MCH: 26.6 pg (ref 26.0–34.0)
MCHC: 31.9 g/dL (ref 30.0–36.0)
MCV: 83.4 fL (ref 78.0–100.0)
Platelets: 222 10*3/uL (ref 150–400)
RBC: 4.51 MIL/uL (ref 3.87–5.11)
RDW: 17.2 % — ABNORMAL HIGH (ref 11.5–15.5)
WBC: 7.9 10*3/uL (ref 4.0–10.5)

## 2017-08-06 LAB — I-STAT CHEM 8, ED
BUN: 20 mg/dL (ref 6–20)
CHLORIDE: 109 mmol/L (ref 101–111)
Calcium, Ion: 1.15 mmol/L (ref 1.15–1.40)
Creatinine, Ser: 1 mg/dL (ref 0.44–1.00)
Glucose, Bld: 147 mg/dL — ABNORMAL HIGH (ref 65–99)
HEMATOCRIT: 35 % — AB (ref 36.0–46.0)
HEMOGLOBIN: 11.9 g/dL — AB (ref 12.0–15.0)
POTASSIUM: 3.7 mmol/L (ref 3.5–5.1)
Sodium: 145 mmol/L (ref 135–145)
TCO2: 26 mmol/L (ref 22–32)

## 2017-08-06 LAB — I-STAT TROPONIN, ED: TROPONIN I, POC: 0.01 ng/mL (ref 0.00–0.08)

## 2017-08-06 LAB — PROTIME-INR
INR: 1.05
Prothrombin Time: 13.6 seconds (ref 11.4–15.2)

## 2017-08-06 LAB — APTT: APTT: 30 s (ref 24–36)

## 2017-08-06 NOTE — ED Triage Notes (Signed)
Reports feeling okay today but when family member called around 1530 and she had slurred speech and difficult to get out what she is trying to say.  Per family had a TIA in January and had "footdrop" to the left.

## 2017-08-07 ENCOUNTER — Emergency Department (HOSPITAL_COMMUNITY): Payer: Medicare Other

## 2017-08-07 DIAGNOSIS — I639 Cerebral infarction, unspecified: Secondary | ICD-10-CM | POA: Diagnosis not present

## 2017-08-07 LAB — CBG MONITORING, ED: Glucose-Capillary: 104 mg/dL — ABNORMAL HIGH (ref 65–99)

## 2017-08-07 MED ORDER — LORAZEPAM 1 MG PO TABS
1.0000 mg | ORAL_TABLET | Freq: Once | ORAL | Status: AC
  Administered 2017-08-07: 1 mg via ORAL
  Filled 2017-08-07: qty 1

## 2017-08-07 MED ORDER — ASPIRIN EC 325 MG PO TBEC
325.0000 mg | DELAYED_RELEASE_TABLET | Freq: Once | ORAL | Status: AC
  Administered 2017-08-07: 325 mg via ORAL
  Filled 2017-08-07: qty 1

## 2017-08-07 MED ORDER — LORAZEPAM 2 MG/ML IJ SOLN
1.0000 mg | Freq: Once | INTRAMUSCULAR | Status: DC
  Filled 2017-08-07: qty 1

## 2017-08-07 NOTE — ED Notes (Signed)
Pt does not want an IV at this time since one is not necessary for MRI, Ativan changed to PO

## 2017-08-07 NOTE — ED Notes (Signed)
Per MRI it will be several hours before she will be taken for scan. Pt states she will need something for anxiety prior to study.  MRI tech will notify this RN @ 30 prior to sending for patient.

## 2017-08-07 NOTE — Discharge Instructions (Addendum)
You need to be compliant with your aspirin therapy.  You are to take 325 mg of aspirin once daily to reduce your risk of another stroke.  Follow-up with your primary doctor in the next week, and return to the ER if symptoms significantly worsen or change.

## 2017-08-07 NOTE — ED Provider Notes (Signed)
[facility] EMERGENCY DEPARTMENT Provider Note   CSN: [id number] Arrival date & time: 08/06/17  1924     History   Chief Complaint Chief Complaint  Patient presents with  . Aphasia    HPI Nancy Houston is a 67 y.o. female.  Patient is a 67 year old female with past medical history of hypertension, hypothyroidism, hypercholesterolemia, and recent admission for TIA.  She presents today for evaluation of difficulty speaking.  At approximately 330 this afternoon, she was speaking with a family member on the phone when she developed slurred speech and difficulty finding her words.  The family member on the other line noticed this and came to her house to check on her.  Her difficulty speaking persisted and she was brought here for further evaluation.  Upon presentation here, her speech is somewhat slurred and slow, but she states that it is improving.  She has no other complaints.  She denies any weakness of her arm or leg.  She denies any facial numbness or tingling.  She denies any headache.  The history is provided by the patient.    Past Medical History:  Diagnosis Date  . Arthritis    "lower back" (05/02/2017)  . Complication of anesthesia    "was told after my hysterectomy not to let anyone put me to sleep; ended up having plantar fasciatis OR; did OK w/that OR"  . High cholesterol   . Hypertension   . Hypothyroidism   . Migraine    "none since the 1980s" (05/02/2017)  . Thyroid disease   . TIA (transient ischemic attack) 05/01/2017    Patient Active Problem List   Diagnosis Date Noted  . TIA (transient ischemic attack) 05/01/2017  . HTN (hypertension) 05/01/2017    Past Surgical History:  Procedure Laterality Date  . APPENDECTOMY    . PLANTAR FASCIA SURGERY Left ~ 2009  . VAGINAL HYSTERECTOMY  1980s     OB History   None      Home Medications    Prior to Admission medications   Medication Sig Start Date End Date Taking?  Authorizing Provider  amLODipine-benazepril (LOTREL) 5-20 MG capsule Take 1 capsule by mouth daily. 08/09/16   [provider]  aspirin 325 MG tablet Take 1 tablet (325 mg total) by mouth daily. 05/04/17   Lenox Ponds, MD  atorvastatin (LIPITOR) 80 MG tablet Take 1 tablet (80 mg total) by mouth daily at 6 PM. 05/03/17   Randel Pigg, Dorma Russell, MD  hydrochlorothiazide (HYDRODIURIL) 25 MG tablet Take 25 mg by mouth daily. 08/09/16   [provider]  levothyroxine (SYNTHROID) 125 MCG tablet Take 125 mcg by mouth daily.     [provider]    Family History Family History  Problem Relation Age of Onset  . Transient ischemic attack Mother     Social History Social History   Tobacco Use  . Smoking status: Current Every Day Smoker    Packs/day: 0.25    Years: 41.00    Pack years: 10.25    Types: Cigarettes  . Smokeless tobacco: Never Used  Substance Use Topics  . Alcohol use: Yes    Comment: 05/02/2017 "might have 12 drinks/year; nothing regular"  . Drug use: No     Allergies   Penicillins   Review of Systems Review of Systems  All other systems reviewed and are negative.    Physical Exam Updated Vital Signs BP (!) 155/96 (BP Location: Left Arm)   Pulse 84  Temp 98.2 F (36.8 C) (Oral)   Resp 18   Ht 5' 6.75" (1.695 m)   Wt 120.2 kg (265 lb)   SpO2 98%   BMI 41.82 kg/m   Physical Exam  Constitutional: She is oriented to person, place, and time. She appears well-developed and well-nourished. No distress.  HENT:  Head: Normocephalic and atraumatic.  Eyes: Pupils are equal, round, and reactive to light. EOM are normal.  Neck: Normal range of motion. Neck supple.  Cardiovascular: Normal rate and regular rhythm. Exam reveals no gallop and no friction rub.  No murmur heard. Pulmonary/Chest: Effort normal and breath sounds normal. No respiratory distress. She has no wheezes.  Abdominal: Soft. Bowel sounds are normal. She exhibits no  distension. There is no tenderness.  Musculoskeletal: Normal range of motion.  Neurological: She is alert and oriented to person, place, and time. No cranial nerve deficit. She exhibits normal muscle tone. Coordination normal.  Speech is somewhat slowed and slurred, however she appears neurologically intact.  Skin: Skin is warm and dry. She is not diaphoretic.  Nursing note and vitals reviewed.    ED Treatments / Results  Labs (all labs ordered are listed, but only abnormal results are displayed) Labs Reviewed  CBC - Abnormal; Notable for the following components:      Result Value   RDW 17.2 (*)    All other components within normal limits  COMPREHENSIVE METABOLIC PANEL - Abnormal; Notable for the following components:   Glucose, Bld 149 (*)    Creatinine, Ser 1.06 (*)    Total Protein 6.4 (*)    Albumin 3.2 (*)    GFR calc non Af Amer 54 (*)    All other components within normal limits  I-STAT CHEM 8, ED - Abnormal; Notable for the following components:   Glucose, Bld 147 (*)    Hemoglobin 11.9 (*)    HCT 35.0 (*)    All other components within normal limits  PROTIME-INR  APTT  DIFFERENTIAL  I-STAT TROPONIN, ED  CBG MONITORING, ED    ED ECG REPORT   Date: 08/07/2017  Rate: 99  Rhythm: normal sinus rhythm  QRS Axis: normal  Intervals: normal  ST/T Wave abnormalities: normal  Conduction Disutrbances:none  Narrative Interpretation:   Old EKG Reviewed: unchanged  I have personally reviewed the EKG tracing and agree with the computerized printout as noted.   Radiology Ct Head Wo Contrast  Result Date: 08/06/2017 CLINICAL DATA:  Slurred speech onset this afternoon. EXAM: CT HEAD WITHOUT CONTRAST TECHNIQUE: Contiguous axial images were obtained from the base of the skull through the vertex without intravenous contrast. COMPARISON:  05/01/2017 FINDINGS: Brain: Stable involutional changes of the brain with moderate small vessel ischemic disease of periventricular white  matter. No acute intracranial hemorrhage, large vascular territory infarct, midline shift or edema. No intra-axial mass nor extra-axial fluid collections. Vascular: No hyperdense vessel sign. Skull: Negative for fracture or focal osseous lesions. Sinuses/Orbits: Stable calcified appearance of the left included nasal turbinate possibly from chronic inflammatory change. Bilateral lens replacements. Other: Partially included noncalcified left parotid mass measuring approximately 2.4 cm. Further correlation with MRI may prove useful as well as ENT consultation. IMPRESSION: 1. Moderate chronic small vessel ischemic disease. No acute intracranial abnormality. 2. 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 For further correlation from an imaging standpoint, MRI is recommended. ENT consultation as recommended previously. 3. Diffusely calcified appearing included left turbinate possibly representing stigmata of chronic inflammation. Electronically  Signed   By: Tollie Eth M.D.   On: 08/06/2017 21:04    Procedures Procedures (including critical care time)  Medications Ordered in ED Medications - No data to display   Initial Impression / Assessment and Plan / ED Course  I have reviewed the triage vital signs and the nursing notes.  Pertinent labs & imaging results that were available during my care of the patient were reviewed by me and considered in my medical decision making (see chart for details).  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  Her speech difficulty has significantly improved while in the emergency department.  I have discussed this case with Dr. Otelia Limes from neurology.  He feels as though the patient has had her complete stroke work-up already in January and that the previously recommended therapy of aspirin 325 mg daily still applies.  The patient has been noncompliant with this since  discharge.  She is to follow-up with her primary doctor in the next week.  Final Clinical Impressions(s) / ED Diagnoses   Final diagnoses:  None    ED Discharge Orders    None       Geoffery Lyons, MD 08/07/17 [phone]

## 2018-05-12 DEATH — deceased

## 2018-11-10 DEATH — deceased

## 2019-12-30 IMAGING — CT CT HEAD W/O CM
3 of 4 series · 15 of 47 positions shown, 18 images · non-contrast
Comparison: 05/01/2017

CLINICAL DATA: Slurred speech onset this afternoon.

EXAM:
CT HEAD WITHOUT CONTRAST
TECHNIQUE: Contiguous axial images were obtained from the base of the skull
through the vertex without intravenous contrast.

[Series 4: head 2.0 h70h · axial · 0.43mm/px · z∈[-82,+40]mm · 9 of 77 slices shown, 12 images]
[im 8/77  brain]
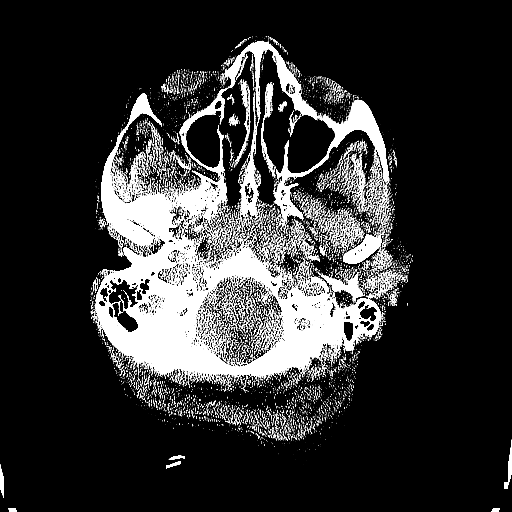
[im 8/77  bone]
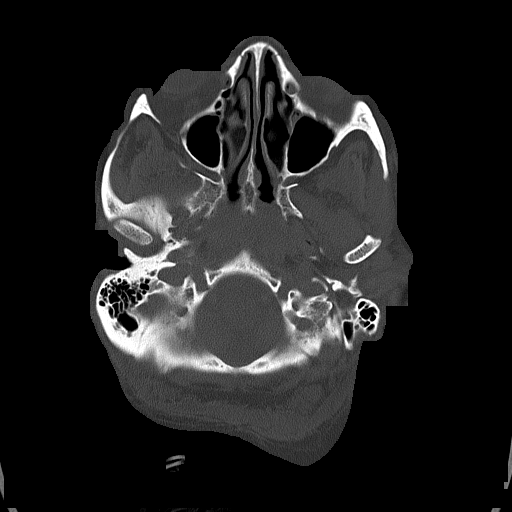
[im 16/77  brain]
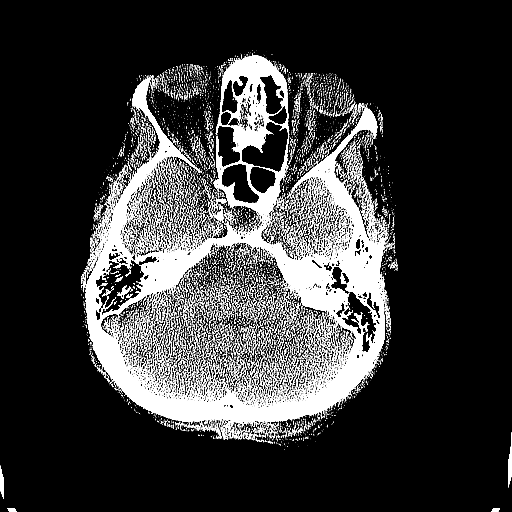
[im 23/77  brain]
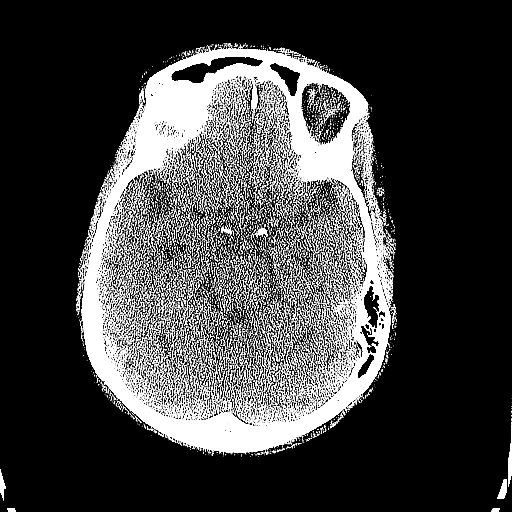
[im 31/77  brain]
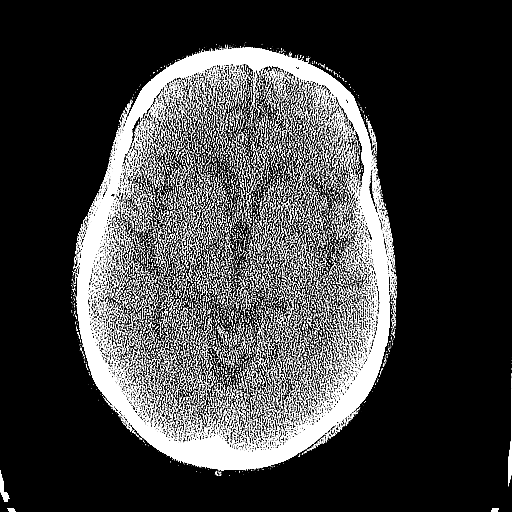
[im 39/77  brain]
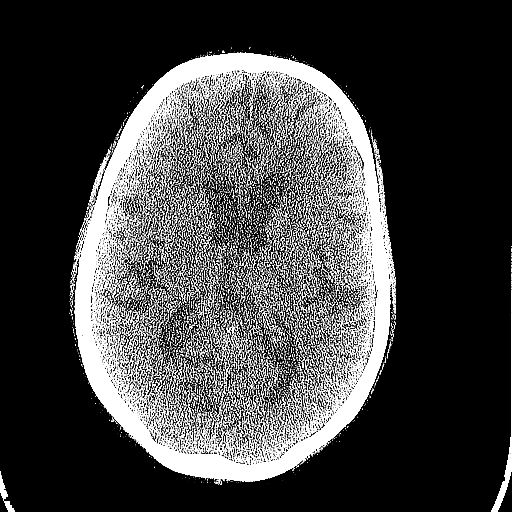
[im 39/77  bone]
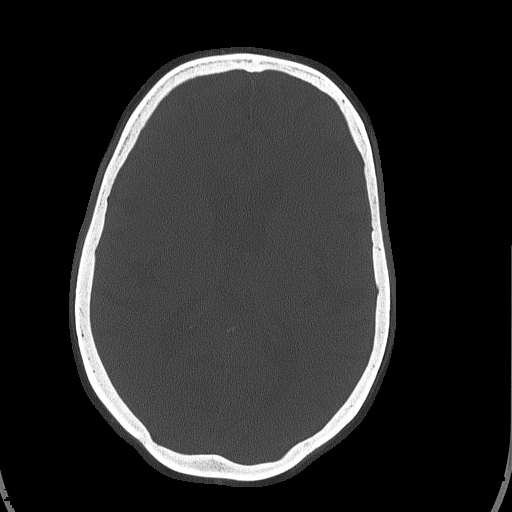
[im 46/77  brain]
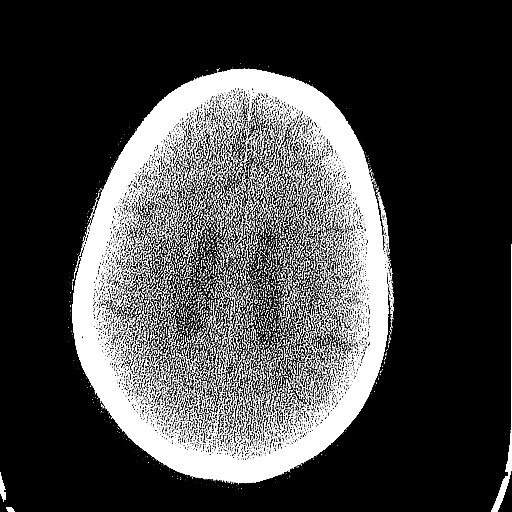
[im 54/77  brain]
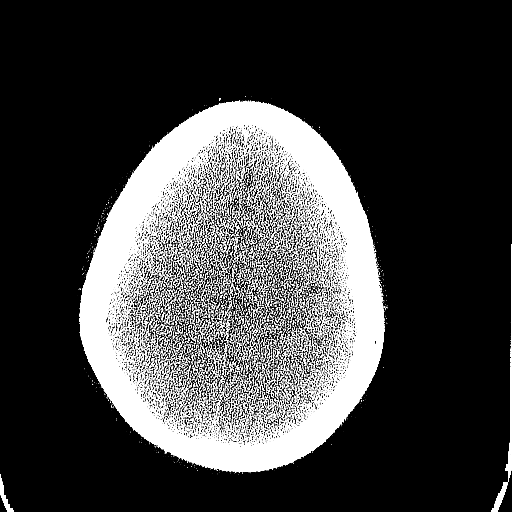
[im 61/77  brain]
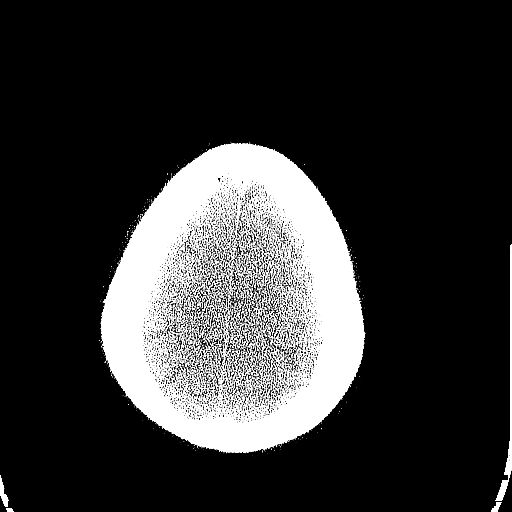
[im 69/77  brain]
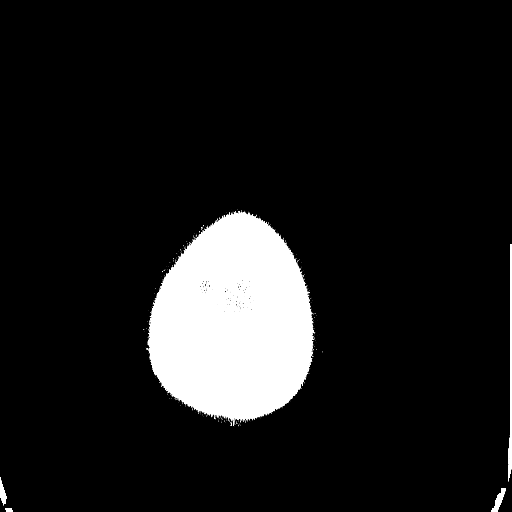
[im 69/77  bone]
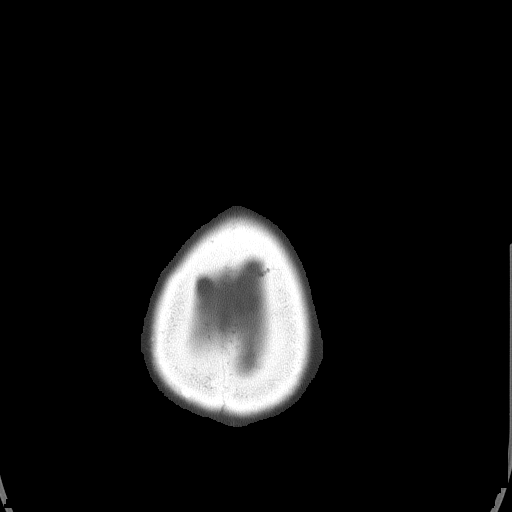

[Series 5: head 3.0 mpr cor · coronal · 0.30mm/px · 3 of 67 slices shown]
[im 23/67  brain]
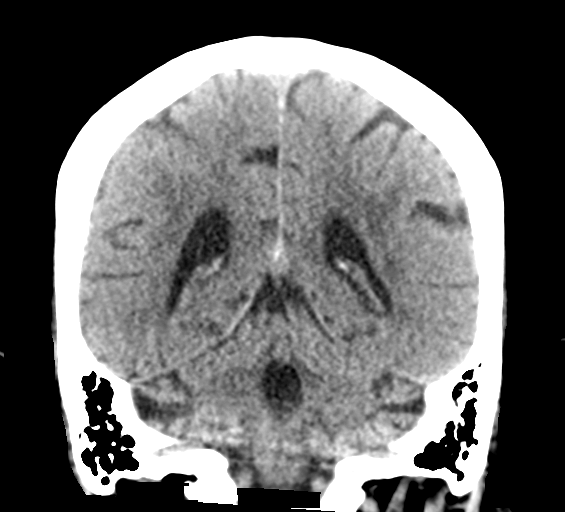
[im 30/67  brain]
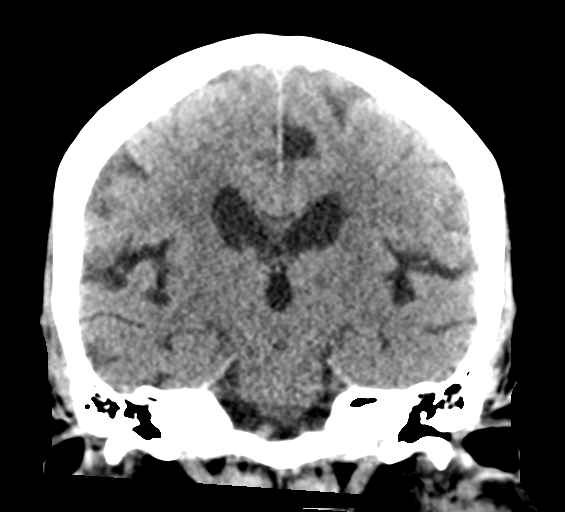
[im 37/67  brain]
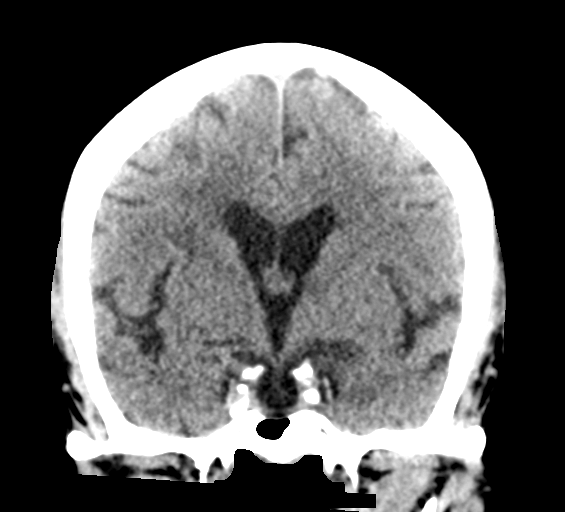

[Series 6: head 3.0 mpr sag · sagittal · 0.30mm/px · 3 of 58 slices shown]
[im 20/58  brain]
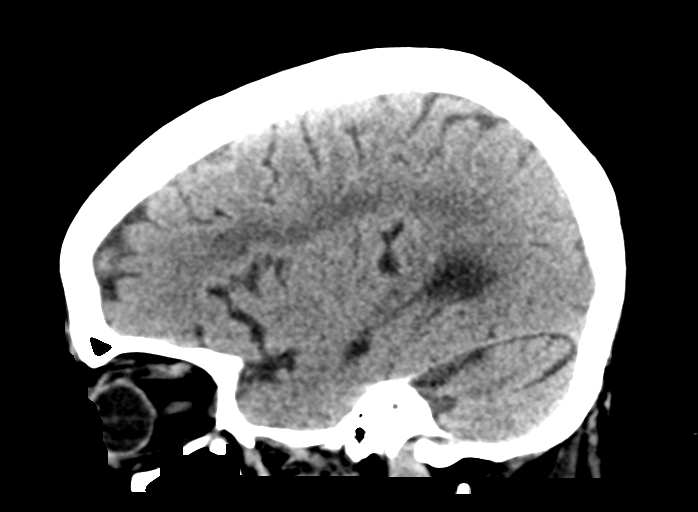
[im 29/58  brain]
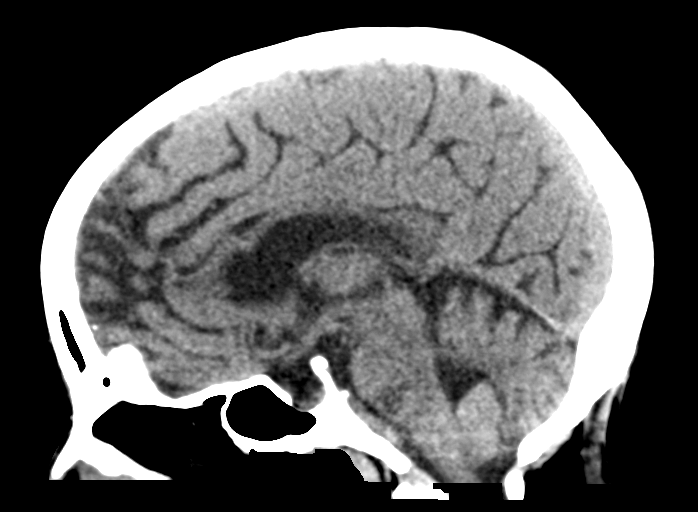
[im 39/58  brain]
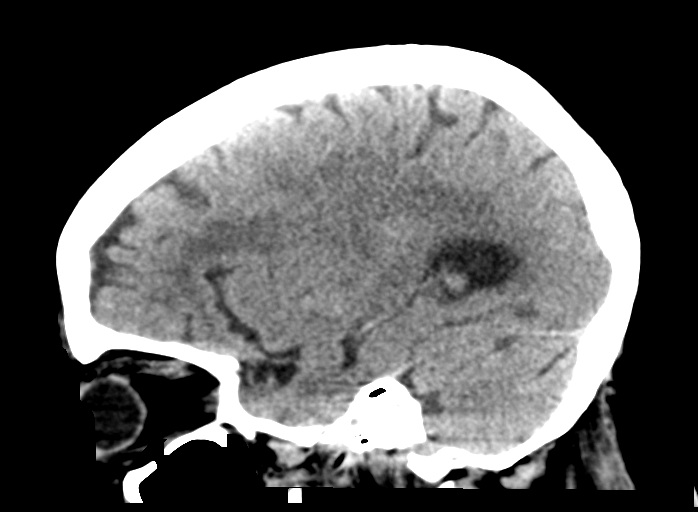

[15 of 47 positions shown; findings below may reference images not displayed]

FINDINGS: Brain: Stable involutional changes of the brain with moderate small
vessel ischemic disease of periventricular white matter. No acute
intracranial hemorrhage, large vascular territory infarct, midline
shift or edema. No intra-axial mass nor extra-axial fluid
collections.

Vascular: No hyperdense vessel sign.

Skull: Negative for fracture or focal osseous lesions.

Sinuses/Orbits: Stable calcified appearance of the left included
nasal turbinate possibly from chronic inflammatory change. Bilateral
lens replacements.

Other: Partially included noncalcified left parotid mass measuring
approximately 2.4 cm. Further correlation with MRI may prove useful
as well as ENT consultation.
IMPRESSION: 1. Moderate chronic small vessel ischemic disease. No acute
intracranial abnormality.
2. 2.4 cm partially visualized left intraparotid mass previously
described on prior comparison study. For further correlation from an
imaging standpoint, MRI is recommended. ENT consultation as
recommended previously.
3. Diffusely calcified appearing included left turbinate possibly
representing stigmata of chronic inflammation.
